# Patient Record
Sex: Female | Born: 1965 | Hispanic: No | Marital: Married | Smoking: Current some day smoker
Health system: Southern US, Community
[De-identification: ages and names within clinical notes are randomized; demographics above are authoritative.]

## PROBLEM LIST (undated history)

## (undated) DIAGNOSIS — I10 Essential (primary) hypertension: Secondary | ICD-10-CM

## (undated) HISTORY — PX: ABLATION: SHX5711

---

## 2005-11-20 ENCOUNTER — Other Ambulatory Visit: Admission: RE | Admit: 2005-11-20 | Discharge: 2005-11-20 | Payer: Self-pay | Admitting: *Deleted

## 2008-10-24 ENCOUNTER — Ambulatory Visit: Payer: Self-pay | Admitting: Obstetrics and Gynecology

## 2008-11-02 ENCOUNTER — Ambulatory Visit: Payer: Self-pay | Admitting: Obstetrics and Gynecology

## 2008-11-09 ENCOUNTER — Ambulatory Visit: Payer: Self-pay | Admitting: Obstetrics and Gynecology

## 2008-11-24 ENCOUNTER — Ambulatory Visit: Payer: Self-pay | Admitting: Obstetrics and Gynecology

## 2012-06-05 ENCOUNTER — Encounter (HOSPITAL_COMMUNITY): Payer: Self-pay | Admitting: *Deleted

## 2012-06-05 DIAGNOSIS — R040 Epistaxis: Secondary | ICD-10-CM | POA: Insufficient documentation

## 2012-06-05 DIAGNOSIS — I10 Essential (primary) hypertension: Secondary | ICD-10-CM | POA: Insufficient documentation

## 2012-06-05 NOTE — ED Notes (Signed)
The pt has had an intermittent nose bleed all day.  None now.  High bp

## 2012-06-06 ENCOUNTER — Emergency Department (HOSPITAL_COMMUNITY)
Admission: EM | Admit: 2012-06-06 | Discharge: 2012-06-06 | Disposition: A | Discharge status: Home / Self Care | Payer: Federal, State, Local not specified - PPO | Attending: Emergency Medicine | Admitting: Emergency Medicine

## 2012-06-06 ENCOUNTER — Encounter (HOSPITAL_COMMUNITY): Payer: Self-pay | Admitting: Emergency Medicine

## 2012-06-06 DIAGNOSIS — I10 Essential (primary) hypertension: Secondary | ICD-10-CM

## 2012-06-06 DIAGNOSIS — R040 Epistaxis: Secondary | ICD-10-CM

## 2012-06-06 HISTORY — DX: Essential (primary) hypertension: I10

## 2012-06-06 MED ORDER — HYDROCHLOROTHIAZIDE 25 MG PO TABS
25.0000 mg | ORAL_TABLET | Freq: Once | ORAL | Status: AC
  Administered 2012-06-06: 25 mg via ORAL
  Filled 2012-06-06: qty 1

## 2012-06-06 MED ORDER — OXYMETAZOLINE HCL 0.05 % NA SOLN
1.0000 | Freq: Two times a day (BID) | NASAL | Status: DC | PRN
  Administered 2012-06-06: 1 via NASAL
  Filled 2012-06-06: qty 15

## 2012-06-06 MED ORDER — AMLODIPINE BESYLATE 10 MG PO TABS
10.0000 mg | ORAL_TABLET | Freq: Once | ORAL | Status: AC
  Administered 2012-06-06: 10 mg via ORAL
  Filled 2012-06-06: qty 1

## 2012-06-06 NOTE — ED Provider Notes (Addendum)
History     CSN: [id number]  Arrival date & time 06/05/12  2330   First MD Initiated Contact with Patient 06/06/12 0057      Chief Complaint  Patient presents with  . Epistaxis    (Consider location/radiation/quality/duration/timing/severity/associated sxs/prior treatment) HPI Comments: Patient reports that she has a history of nasal congestion and allergies pretty significantly, has been worse the last few days and she also has been out of her blood pressure medications for the last 4 days and simply has not had the time to go by the pharmacy to pick them up. She developed a nosebleed earlier this morning which improved after she held pressure and held her face forward. There was some oozing that remained for approximately one hour but eventually stopped. She reports she does not take aspirin, Plavix, Coumadin or any other blood thinners and a regular basis. Typically she does not get nosebleeds. She denies any recent trauma, fevers, earache or sore throat. Most of the blood was from the right nares but a little trickled from the left side and she also tasted blood in the back of her throat. After the symptoms resolved, the patient was eating dinner this evening and then later had come home at about 8 PM at on the way home the bleeding started again. She again held pressure with improvement of the bleeding and by the time she arrived here in the emergency department the bleeding has again stopped. She denies any shortness of breath, nausea or vomiting.  Patient is a 46 y.o. female presenting with nosebleeds. The history is provided by the patient and the spouse.  Epistaxis     Past Medical History  Diagnosis Date  . Hypertension     History reviewed. No pertinent past surgical history.  History reviewed. No pertinent family history.  History  Substance Use Topics  . Smoking status: Current Some Day Smoker  . Smokeless tobacco: Not on file  . Alcohol Use: Yes    OB History    Grav Para Term Preterm Abortions TAB SAB Ect Mult Living                  Review of Systems  Constitutional: Negative for fever and chills.  HENT: Positive for nosebleeds and congestion. Negative for sore throat, trouble swallowing and sinus pressure.   Respiratory: Negative for shortness of breath.   Cardiovascular: Negative for chest pain.  Gastrointestinal: Negative for nausea and abdominal pain.  Neurological: Negative for numbness and headaches.  All other systems reviewed and are negative.    Allergies  Ace inhibitors and Molds & smuts  Home Medications   Current Outpatient Rx  Name Route Sig Dispense Refill  . AMLODIPINE-VALSARTAN-HCTZ 10-160-25 MG PO TABS Oral Take 1 tablet by mouth daily.    Marland Kitchen DANDELION ROOT PO Oral Take 1 capsule by mouth 3 (three) times a week. Does not take on any specific day    . ADULT MULTIVITAMIN W/MINERALS CH Oral Take 1 tablet by mouth daily.    Marland Kitchen FISH OIL 1200 MG PO CAPS Oral Take 1 capsule by mouth daily.    . SULINDAC 150 MG PO TABS Oral Take 150 mg by mouth daily.      BP 164/89  Pulse 86  Temp 98.2 F (36.8 C) (Oral)  Resp 18  SpO2 98%  LMP 05/05/2012  Physical Exam  Nursing note and vitals reviewed. Constitutional: She is oriented to person, place, and time. She appears well-developed and well-nourished. No distress.  HENT:  Head: Normocephalic.  Nose: Mucosal edema present. No rhinorrhea, nose lacerations, nasal deformity or septal deviation. Epistaxis is observed. Right sinus exhibits no maxillary sinus tenderness and no frontal sinus tenderness. Left sinus exhibits no maxillary sinus tenderness and no frontal sinus tenderness.  Mouth/Throat: Uvula is midline and mucous membranes are normal. Mucous membranes are not pale and not dry.       Small amount of blood in the posterior oropharynx without any obstruction  Eyes: Pupils are equal, round, and reactive to light.  Neck: Normal range of motion. Neck supple.  Cardiovascular:  Normal rate and regular rhythm.   Pulmonary/Chest: Effort normal.  Abdominal: Soft. She exhibits no distension. There is no tenderness.  Neurological: She is alert and oriented to person, place, and time. No cranial nerve deficit.  Skin: Skin is warm and dry. No rash noted. She is not diaphoretic. No pallor.  Psychiatric: She has a normal mood and affect.    ED Course  Procedures (including critical care time)  Labs Reviewed - No data to display No results found.   1. Epistaxis   2. Hypertension     2:56 AM Patient reports she did have a brief episode of some bleeding again when she was taking her blood pressure medication but it was controllable and again stopped on its own. I again asked her if she wished to have packing done and she declined. I reassured her and she promises to get her blood pressure medication as soon as possible tomorrow and I have referred her to ENT for followup next week.  MDM   Patient likely with epistaxis due to a combination of hypertension as well as history of nasal and sinus issues. The patient is able to get it to stop on its own. I do not suspect A. arterial bleed. Discussed risks and benefits of packing her nose and she has declined at this time. We agreed to give her her blood pressure medications, monitor her for the next one to 2 hours for any recurrence. If no recurrent bleeding occurs, plan is to discharge her home with some Afrin. She understands that Afrin may cause her blood pressure to increase in that it is important that she restart her blood pressure medications at home. I will also refer her to ENT for appropriate followup later this week for reevaluation.        Gavin Pound. Oletta Lamas, MD 06/06/12 4098  Gavin Pound. Oletta Lamas, MD 06/06/12 1191

## 2012-06-06 NOTE — ED Notes (Addendum)
Pt. Reports nose bleeding since 11am yesterday. Comes and goes. Currently bleeding. Pt. Is sitting up in [place] putting pressure on the bridge of the nose.  "Right now it's not running down my throat like it was". Reports small clots. A.O. X 4. NAD. Respirations even and regular. Denies difficulty breathing.  Hypertensive. States " I haven't taken my blood pressure medication in several days. I am out."

## 2012-06-06 NOTE — ED Notes (Addendum)
Pt. D/c home. Ambulatory. Verbalized understanding of d/c instructions. Denies and pain. Denies any further questions at this time. Skin warm, dry and intact. Respirations even and regular. A.O. X 4. NAD. Vitals stable.

## 2012-06-06 NOTE — ED Notes (Signed)
Dr. Ghim at bedside. 

## 2012-06-06 NOTE — Discharge Instructions (Signed)
 Nosebleed Nosebleeds can be caused by many conditions including trauma, infections, polyps, foreign bodies, dry mucous membranes or climate, medications and air conditioning. Most nosebleeds occur in the front of the nose. It is because of this location that most nosebleeds can be controlled by pinching the nostrils gently and continuously. Do this for at least 10 to 20 minutes. The reason for this long continuous pressure is that you must hold it long enough for the blood to clot. If during that 10 to 20 minute time period, pressure is released, the process may have to be started again. The nosebleed may stop by itself, quit with pressure, need concentrated heating (cautery) or stop with pressure from packing. HOME CARE INSTRUCTIONS   If your nose was packed, try to maintain the pack inside until your caregiver removes it. If a gauze pack was used and it starts to fall out, gently replace or cut the end off. Do not cut if a balloon catheter was used to pack the nose. Otherwise, do not remove unless instructed.   Avoid blowing your nose for 12 hours after treatment. This could dislodge the pack or clot and start bleeding again.   If the bleeding starts again, sit up and bending forward, gently pinch the front half of your nose continuously for 20 minutes.   If bleeding was caused by dry mucous membranes, cover the inside of your nose every morning with a petroleum or antibiotic ointment. Use your little fingertip as an applicator. Do this as needed during dry weather. This will keep the mucous membranes moist and allow them to heal.   Maintain humidity in your home by using less air conditioning or using a humidifier.   Do not use aspirin or medications which make bleeding more likely. Your caregiver can give you recommendations on this.   Resume normal activities as able but try to avoid straining, lifting or bending at the waist for several days.   If the nosebleeds become recurrent and the cause  is unknown, your caregiver may suggest laboratory tests.  SEEK IMMEDIATE MEDICAL CARE IF:   Bleeding recurs and cannot be controlled.   There is unusual bleeding from or bruising on other parts of the body.   You have a fever.   Nosebleeds continue.   There is any worsening of the condition which originally brought you in.   You become lightheaded, feel faint, become sweaty or vomit blood.  MAKE SURE YOU:   Understand these instructions.   Will watch your condition.   Will get help right away if you are not doing well or get worse.  Document Released: 07/30/2005 Document Revised: 10/09/2011 Document Reviewed: 09/21/2009 [facility] Patient Information 2012 [place], [place].    Arterial Hypertension Arterial hypertension (high blood pressure) is a condition of elevated pressure in your blood vessels. Hypertension over a long period of time is a risk factor for strokes, heart attacks, and heart failure. It is also the leading cause of kidney (renal) failure.  CAUSES   In Adults -- Over 90% of all hypertension has no known cause. This is called essential or primary hypertension. In the other 10% of people with hypertension, the increase in blood pressure is caused by another disorder. This is called secondary hypertension. Important causes of secondary hypertension are:   Heavy alcohol  use.   Obstructive sleep apnea.   Hyperaldosterosim (Conn's syndrome).   Steroid use.   Chronic kidney failure.   Hyperparathyroidism.   Medications.   Renal artery stenosis.  Pheochromocytoma.   Cushing's disease.   Coarctation of the aorta.   Scleroderma renal crisis.   Licorice (in excessive amounts).   Drugs (cocaine, methamphetamine).  Your caregiver can explain any items above that apply to you.  In Children -- Secondary hypertension is more common and should always be considered.   Pregnancy -- Few women of childbearing age have high blood pressure. However, up to 10% of  them develop hypertension of pregnancy. Generally, this will not harm the woman. It may be a sign of 3 complications of pregnancy: preeclampsia, HELLP syndrome, and eclampsia. Follow up and control with medication is necessary.  SYMPTOMS   This condition normally does not produce any noticeable symptoms. It is usually found during a routine exam.   Malignant hypertension is a late problem of high blood pressure. It may have the following symptoms:   Headaches.   Blurred vision.   End-organ damage (this means your kidneys, heart, lungs, and other organs are being damaged).   Stressful situations can increase the blood pressure. If a person with normal blood pressure has their blood pressure go up while being seen by their caregiver, this is often termed white coat hypertension. Its importance is not known. It may be related with eventually developing hypertension or complications of hypertension.   Hypertension is often confused with mental tension, stress, and anxiety.  DIAGNOSIS  The diagnosis is made by 3 separate blood pressure measurements. They are taken at least 1 week apart from each other. If there is organ damage from hypertension, the diagnosis may be made without repeat measurements. Hypertension is usually identified by having blood pressure readings:  Above 140/90 mmHg measured in both arms, at 3 separate times, over a couple weeks.   Over 130/80 mmHg should be considered a risk factor and may require treatment in patients with diabetes.  Blood pressure readings over 120/80 mmHg are called pre-hypertension even in non-diabetic patients. To get a true blood pressure measurement, use the following guidelines. Be aware of the factors that can alter blood pressure readings.  Take measurements at least 1 hour after caffeine.   Take measurements 30 minutes after smoking and without any stress. This is another reason to quit smoking - it raises your blood pressure.   Use a  proper cuff size. Ask your caregiver if you are not sure about your cuff size.   Most home blood pressure cuffs are automatic. They will measure systolic and diastolic pressures. The systolic pressure is the pressure reading at the start of sounds. Diastolic pressure is the pressure at which the sounds disappear. If you are elderly, measure pressures in multiple postures. Try sitting, lying or standing.   Sit at rest for a minimum of 5 minutes before taking measurements.   You should not be on any medications like decongestants. These are found in many cold medications.   Record your blood pressure readings and review them with your caregiver.  If you have hypertension:  Your caregiver may do tests to be sure you do not have secondary hypertension (see causes above).   Your caregiver may also look for signs of metabolic syndrome. This is also called Syndrome X or Insulin  Resistance Syndrome. You may have this syndrome if you have type 2 diabetes, abdominal obesity, and abnormal blood lipids in addition to hypertension.   Your caregiver will take your medical and family history and perform a physical exam.   Diagnostic tests may include blood tests (for glucose, cholesterol, potassium, and kidney function),  a urinalysis, or an EKG. Other tests may also be necessary depending on your condition.  PREVENTION  There are important lifestyle issues that you can adopt to reduce your chance of developing hypertension:  Maintain a normal weight.   Limit the amount of salt (sodium) in your diet.   Exercise often.   Limit alcohol  intake.   Get enough potassium in your diet. Discuss specific advice with your caregiver.   Follow a DASH diet (dietary approaches to stop hypertension). This diet is rich in fruits, vegetables, and low-fat dairy products, and avoids certain fats.  PROGNOSIS  Essential hypertension cannot be cured. Lifestyle changes and medical treatment can lower blood pressure and  reduce complications. The prognosis of secondary hypertension depends on the underlying cause. Many people whose hypertension is controlled with medicine or lifestyle changes can live a normal, healthy life.  RISKS AND COMPLICATIONS  While high blood pressure alone is not an illness, it often requires treatment due to its short- and long-term effects on many organs. Hypertension increases your risk for:  CVAs or strokes (cerebrovascular accident).   Heart failure due to chronically high blood pressure (hypertensive cardiomyopathy).   Heart attack (myocardial infarction).   Damage to the retina (hypertensive retinopathy).   Kidney failure (hypertensive nephropathy).  Your caregiver can explain list items above that apply to you. Treatment of hypertension can significantly reduce the risk of complications. TREATMENT   For overweight patients, weight loss and regular exercise are recommended. Physical fitness lowers blood pressure.   Mild hypertension is usually treated with diet and exercise. A diet rich in fruits and vegetables, fat-free dairy products, and foods low in fat and salt (sodium) can help lower blood pressure. Decreasing salt intake decreases blood pressure in a 1/3 of people.   Stop smoking if you are a smoker.  The steps above are highly effective in reducing blood pressure. While these actions are easy to suggest, they are difficult to achieve. Most patients with moderate or severe hypertension end up requiring medications to bring their blood pressure down to a normal level. There are several classes of medications for treatment. Blood pressure pills (antihypertensives) will lower blood pressure by their different actions. Lowering the blood pressure by 10 mmHg may decrease the risk of complications by as much as 25%. The goal of treatment is effective blood pressure control. This will reduce your risk for complications. Your caregiver will help you determine the best treatment  for you according to your lifestyle. What is excellent treatment for one person, may not be for you. HOME CARE INSTRUCTIONS   Do not smoke.   Follow the lifestyle changes outlined in the Prevention section.   If you are on medications, follow the directions carefully. Blood pressure medications must be taken as prescribed. Skipping doses reduces their benefit. It also puts you at risk for problems.   Follow up with your caregiver, as directed.   If you are asked to monitor your blood pressure at home, follow the guidelines in the Diagnosis section above.  SEEK MEDICAL CARE IF:   You think you are having medication side effects.   You have recurrent headaches or lightheadedness.   You have swelling in your ankles.   You have trouble with your vision.  SEEK IMMEDIATE MEDICAL CARE IF:   You have sudden onset of chest pain or pressure, difficulty breathing, or other symptoms of a heart attack.   You have a severe headache.   You have symptoms of a stroke (such  as sudden weakness, difficulty speaking, difficulty walking).  MAKE SURE YOU:   Understand these instructions.   Will watch your condition.   Will get help right away if you are not doing well or get worse.  Document Released: 10/20/2005 Document Revised: 10/09/2011 Document Reviewed: 05/20/2007 [facility] Patient Information 2012 [place], [place].

## 2012-11-06 ENCOUNTER — Encounter (HOSPITAL_COMMUNITY): Payer: Self-pay | Admitting: Emergency Medicine

## 2012-11-06 ENCOUNTER — Emergency Department (HOSPITAL_COMMUNITY)
Admission: EM | Admit: 2012-11-06 | Discharge: 2012-11-07 | Disposition: A | Discharge status: Home / Self Care | Payer: Federal, State, Local not specified - PPO | Attending: Emergency Medicine | Admitting: Emergency Medicine

## 2012-11-06 DIAGNOSIS — F10929 Alcohol use, unspecified with intoxication, unspecified: Secondary | ICD-10-CM

## 2012-11-06 DIAGNOSIS — E876 Hypokalemia: Secondary | ICD-10-CM | POA: Insufficient documentation

## 2012-11-06 DIAGNOSIS — F1010 Alcohol abuse, uncomplicated: Secondary | ICD-10-CM | POA: Insufficient documentation

## 2012-11-06 DIAGNOSIS — Z79899 Other long term (current) drug therapy: Secondary | ICD-10-CM | POA: Insufficient documentation

## 2012-11-06 DIAGNOSIS — R4189 Other symptoms and signs involving cognitive functions and awareness: Secondary | ICD-10-CM

## 2012-11-06 DIAGNOSIS — F17200 Nicotine dependence, unspecified, uncomplicated: Secondary | ICD-10-CM | POA: Insufficient documentation

## 2012-11-06 DIAGNOSIS — I10 Essential (primary) hypertension: Secondary | ICD-10-CM | POA: Insufficient documentation

## 2012-11-06 LAB — ACETAMINOPHEN LEVEL: Acetaminophen (Tylenol), Serum: <15 ug/mL (ref 10–30)

## 2012-11-06 LAB — COMPREHENSIVE METABOLIC PANEL
ALT: 33 U/L (ref 0–35)
Alkaline Phosphatase: 88 U/L (ref 39–117)
BUN: 8 mg/dL (ref 6–23)
Chloride: 96 mEq/L (ref 96–112)
GFR calc Af Amer: >90 mL/min (ref >90)
Glucose, Bld: 120 mg/dL — ABNORMAL HIGH (ref 70–99)
Potassium: 3.1 mEq/L — ABNORMAL LOW (ref 3.5–5.1)
Sodium: 135 mEq/L (ref 135–145)
Total Bilirubin: 0.1 mg/dL — ABNORMAL LOW (ref 0.3–1.2)
Total Protein: 7.8 g/dL (ref 6.0–8.3)

## 2012-11-06 LAB — CBC WITH DIFFERENTIAL/PLATELET
Eosinophils Absolute: 0 10*3/uL (ref 0.0–0.7)
Hemoglobin: 13.4 g/dL (ref 12.0–15.0)
Lymphocytes Relative: 23 % (ref 12–46)
Lymphs Abs: 1.5 10*3/uL (ref 0.7–4.0)
MCH: 31.4 pg (ref 26.0–34.0)
Monocytes Relative: 8 % (ref 3–12)
Neutro Abs: 4.5 10*3/uL (ref 1.7–7.7)
Neutrophils Relative %: 68 % (ref 43–77)
Platelets: 342 10*3/uL (ref 150–400)
RBC: 4.27 MIL/uL (ref 3.87–5.11)
WBC: 6.6 10*3/uL (ref 4.0–10.5)

## 2012-11-06 LAB — RAPID URINE DRUG SCREEN, HOSP PERFORMED
Barbiturates: NOT DETECTED
Benzodiazepines: NOT DETECTED
Cocaine: NOT DETECTED
Tetrahydrocannabinol: NOT DETECTED

## 2012-11-06 LAB — ETHANOL: Alcohol, Ethyl (B): 364 mg/dL — ABNORMAL HIGH (ref 0–11)

## 2012-11-06 MED ORDER — SODIUM CHLORIDE 0.9 % IV SOLN
INTRAVENOUS | Status: DC
  Administered 2012-11-06: 22:00:00 via INTRAVENOUS

## 2012-11-06 NOTE — ED Notes (Signed)
Pt refused temp 

## 2012-11-06 NOTE — ED Notes (Addendum)
Upon reentering Res Room B pt restraints were observed to be untied.  Husband is in the room with wife.

## 2012-11-06 NOTE — ED Notes (Signed)
ZOX:[id number]<AV> Expected date:<BR> Expected time:<BR> Means of arrival:<BR> Comments:<BR> ems

## 2012-11-06 NOTE — ED Notes (Signed)
Per pt's friend. They were at at a friends house drinking when the pt became altered. States her eyes rolled back in her head and she was "salivating."  The pt is combative and completely uncooperative, kicking at staff and calling them names. Pt also refused to answer any questions.

## 2012-11-06 NOTE — ED Provider Notes (Signed)
History     CSN: [id number]  Arrival date & time 11/06/12  2152   First MD Initiated Contact with Patient 11/06/12 2202      Chief complaint altered mental status  (Consider location/radiation/quality/duration/timing/severity/associated sxs/prior treatment) HPI  Patient presents to the emergency department via EMS. Patient evidently was combative in route to the emergency department. On arrival to the ER patient is combative to the nursing staff and refuses to tell us her name or what is going on. Whenever we asked her questions she answers back such as "where are you", " do you know where you are".  Patient's friend rode in the ambulance with her and the friend is the one who called EMS. She reports that they had been drinking tonight. She states the patient did not drink more than usual. She states they've been together since about 4 PM and she was drinking beer. She states that prior to calling EMS  patient had an episode where she was drooling,  her eyes rolled back in her head while she was sitting up. She denies any jerking. She states that patient has not been taking any pills. She states patient has been depressed because her mother died recently however she does not feel the patient is suicidal.  PCP Dr. Belva Crome  Past Medical History  Diagnosis Date  . Hypertension     Past Surgical History  Procedure Date  . Ablation     No family history on file.  History  Substance Use Topics  . Smoking status: Current Some Day Smoker  . Smokeless tobacco: Not on file  . Alcohol Use: Yes  employed Lives at home Lives with spouse  OB History    Grav Para Term Preterm Abortions TAB SAB Ect Mult Living                  Review of Systems  Unable to perform ROS: Other    Allergies  Ace inhibitors and Molds & smuts  Home Medications   Current Outpatient Rx  Name  Route  Sig  Dispense  Refill  . AMLODIPINE-VALSARTAN-HCTZ 10-160-25 MG PO TABS   Oral   Take 1 tablet by  mouth daily.         Marland Kitchen DANDELION ROOT PO   Oral   Take 1 capsule by mouth 3 (three) times a week. Does not take on any specific day         . ADULT MULTIVITAMIN W/MINERALS CH   Oral   Take 1 tablet by mouth daily.         Marland Kitchen FISH OIL 1200 MG PO CAPS   Oral   Take 1 capsule by mouth daily.         . SULINDAC 150 MG PO TABS   Oral   Take 150 mg by mouth daily.           BP 131/89  Pulse 94  Resp 23  SpO2 91%  Vital signs normal    Physical Exam  Nursing note and vitals reviewed. Constitutional: She appears well-developed and well-nourished.  Non-toxic appearance. She does not appear ill. No distress.       Patient is combative and refuses to be touched for exam she's totally uncooperative and will not open her mouth  HENT:  Head: Normocephalic and atraumatic.  Right Ear: External ear normal.  Left Ear: External ear normal.  Nose: Nose normal. No mucosal edema or rhinorrhea.  Mouth/Throat: Mucous membranes are normal. No dental abscesses  or uvula swelling.  Eyes: Conjunctivae normal and EOM are normal. Pupils are equal, round, and reactive to light.  Neck: Normal range of motion and full passive range of motion without pain. Neck supple.  Pulmonary/Chest: Effort normal and breath sounds normal. No respiratory distress. She has no rhonchi. She exhibits no crepitus.  Abdominal: Normal appearance.  Musculoskeletal: Normal range of motion. She exhibits no edema and no tenderness.       Moves all extremities well.   Neurological: She is alert. She has normal strength. No cranial nerve deficit.  Skin: Skin is warm, dry and intact. No rash noted. No erythema. No pallor.  Psychiatric: Her speech is normal. Her mood appears not anxious.       Patient is uncooperative and aggressive to staff    ED Course  Procedures (including critical care time)  Medications  0.9 %  sodium chloride infusion (0  Intravenous Stopped 11/06/12 2331)     2330 has been here., He states  patient was drinking tonight but no more than usual. He states she started acting "weird" meaning that she was staring and not responsive to them. He states he's never seen her act that way before. Patient continues to be verbally belligerent but now she is more talkative. He states she's back to her usual self. He is agreeable to taking her home after her labs tests are resulted if they were okay. Husband had taking patient out of restraints that were placed on her when she continued to be physically aggressive to the nursing staff. She also had her IV pulled out.  Recheck 00:30 pt awake, still verbally hostile but no longer physically aggressive, husband at bedside.   Results for orders placed during the hospital encounter of 11/06/12  ETHANOL      Component Value Range   Alcohol, Ethyl (B) 364 (*) 0 - 11 mg/dL  CBC WITH DIFFERENTIAL      Component Value Range   WBC 6.6  4.0 - 10.5 K/uL   RBC 4.27  3.87 - 5.11 MIL/uL   Hemoglobin 13.4  12.0 - 15.0 g/dL   HCT 04.5  40.9 - 81.1 %   MCV 87.1  78.0 - 100.0 fL   MCH 31.4  26.0 - 34.0 pg   MCHC 36.0  30.0 - 36.0 g/dL   RDW 91.4  78.2 - 95.6 %   Platelets 342  150 - 400 K/uL   Neutrophils Relative 68  43 - 77 %   Neutro Abs 4.5  1.7 - 7.7 K/uL   Lymphocytes Relative 23  12 - 46 %   Lymphs Abs 1.5  0.7 - 4.0 K/uL   Monocytes Relative 8  3 - 12 %   Monocytes Absolute 0.5  0.1 - 1.0 K/uL   Eosinophils Relative 0  0 - 5 %   Eosinophils Absolute 0.0  0.0 - 0.7 K/uL   Basophils Relative 1  0 - 1 %   Basophils Absolute 0.0  0.0 - 0.1 K/uL  COMPREHENSIVE METABOLIC PANEL      Component Value Range   Sodium 135  135 - 145 mEq/L   Potassium 3.1 (*) 3.5 - 5.1 mEq/L   Chloride 96  96 - 112 mEq/L   CO2 23  19 - 32 mEq/L   Glucose, Bld 120 (*) 70 - 99 mg/dL   BUN 8  6 - 23 mg/dL   Creatinine, Ser 2.13  0.50 - 1.10 mg/dL   Calcium 9.5  8.4 - 08.6  mg/dL   Total Protein 7.8  6.0 - 8.3 g/dL   Albumin 4.3  3.5 - 5.2 g/dL   AST 34  0 - 37 U/L   ALT  33  0 - 35 U/L   Alkaline Phosphatase 88  39 - 117 U/L   Total Bilirubin 0.1 (*) 0.3 - 1.2 mg/dL   GFR calc non Af Amer >90  >90 mL/min   GFR calc Af Amer >90  >90 mL/min  ACETAMINOPHEN LEVEL      Component Value Range   Acetaminophen (Tylenol), Serum <15.0  10 - 30 ug/mL  SALICYLATE LEVEL      Component Value Range   Salicylate Lvl <2.0 (*) 2.8 - 20.0 mg/dL  URINE RAPID DRUG SCREEN (HOSP PERFORMED)      Component Value Range   Opiates NONE DETECTED  NONE DETECTED   Cocaine NONE DETECTED  NONE DETECTED   Benzodiazepines NONE DETECTED  NONE DETECTED   Amphetamines NONE DETECTED  NONE DETECTED   Tetrahydrocannabinol NONE DETECTED  NONE DETECTED   Barbiturates NONE DETECTED  NONE DETECTED    Laboratory interpretation all normal except hypokalemia, alcohol intoxication    Date: 11/06/2012  Rate: 103  Rhythm: sinus tachycardia  QRS Axis: normal  Intervals: normal  ST/T Wave abnormalities: nonspecific T wave changes  Conduction Disutrbances:none  Narrative Interpretation:   Old EKG Reviewed: none available    1. Alcohol intoxication   2. Unresponsive episode   3. Hypokalemia    New Prescriptions   POTASSIUM CHLORIDE SA (K-DUR,KLOR-CON) 20 MEQ TABLET    Take 1 tablet (20 mEq total) by mouth 2 (two) times daily.     Plan discharge when more sober  Devoria Albe, MD, FACEP   MDM          Ward Givens, MD 11/07/12 [phone]

## 2012-11-07 MED ORDER — ZIPRASIDONE MESYLATE 20 MG IM SOLR
INTRAMUSCULAR | Status: AC
  Filled 2012-11-07: qty 20

## 2012-11-07 MED ORDER — POTASSIUM CHLORIDE CRYS ER 20 MEQ PO TBCR: 20.0000 meq | EXTENDED_RELEASE_TABLET | Freq: Two times a day (BID) | ORAL | Status: AC

## 2012-11-07 MED ORDER — ZIPRASIDONE MESYLATE 20 MG IM SOLR
10.0000 mg | Freq: Once | INTRAMUSCULAR | Status: AC
Start: 2012-11-07 — End: 2012-11-07
  Administered 2012-11-07: 10 mg via INTRAMUSCULAR

## 2012-11-07 NOTE — ED Notes (Signed)
Spoke with patient husband Genelle Bal at [phone], advised patient may be discharged upon his arrival. He states he will be here shortly

## 2012-11-07 NOTE — ED Notes (Signed)
Pt became unruly with Dr Norlene Campbell.  Dr Norlene Campbell prescribed Geodon 10mg .  Pt's husband was called to bedside where he gave verbal consent for the staff of [facility] to keep the patient until a time when her alcohol level returned to an acceptable rate.  Pt's belongings to include a pair of jeans with $180 and a purse where given to patients husband to take home.  Pt's husband advised that he should return in the morning to pick up the patient.

## 2012-11-07 NOTE — Discharge Instructions (Signed)
You shouldn't drink so much!! You should have Dr Belva Crome recheck you in the next 1-2 weeks.

## 2012-11-07 NOTE — ED Provider Notes (Signed)
Care assumed from Dr. Lynelle Doctor. Patient with alcohol intoxication and altered mental status.  Do to significantly elevated alcohol level, plan was to keep patient in the emergency department and allow to sober given fear for aspiration risk. Patient became agitated, verbally abusive towards staff. She is still very unsteady on her feet and making illogical comments. Patient given Geodon to help with agitation. We'll continue to monitor her closely and expect that she'll be able to be discharged in the morning.  Olivia Mackie, MD 11/07/12 [phone]

## 2016-05-08 DIAGNOSIS — F4322 Adjustment disorder with anxiety: Secondary | ICD-10-CM | POA: Diagnosis not present

## 2016-05-08 DIAGNOSIS — F411 Generalized anxiety disorder: Secondary | ICD-10-CM | POA: Diagnosis not present

## 2016-05-19 DIAGNOSIS — F5101 Primary insomnia: Secondary | ICD-10-CM | POA: Diagnosis not present

## 2016-05-19 DIAGNOSIS — M545 Low back pain: Secondary | ICD-10-CM | POA: Diagnosis not present

## 2016-05-19 DIAGNOSIS — I10 Essential (primary) hypertension: Secondary | ICD-10-CM | POA: Diagnosis not present

## 2016-05-28 DIAGNOSIS — F4310 Post-traumatic stress disorder, unspecified: Secondary | ICD-10-CM | POA: Diagnosis not present

## 2016-06-18 DIAGNOSIS — F4311 Post-traumatic stress disorder, acute: Secondary | ICD-10-CM | POA: Diagnosis not present

## 2016-06-18 DIAGNOSIS — F410 Panic disorder [episodic paroxysmal anxiety] without agoraphobia: Secondary | ICD-10-CM | POA: Diagnosis not present

## 2016-07-02 DIAGNOSIS — F4310 Post-traumatic stress disorder, unspecified: Secondary | ICD-10-CM | POA: Diagnosis not present

## 2016-07-23 DIAGNOSIS — F4310 Post-traumatic stress disorder, unspecified: Secondary | ICD-10-CM | POA: Diagnosis not present

## 2016-08-20 DIAGNOSIS — F4311 Post-traumatic stress disorder, acute: Secondary | ICD-10-CM | POA: Diagnosis not present

## 2016-11-13 DIAGNOSIS — Z0000 Encounter for general adult medical examination without abnormal findings: Secondary | ICD-10-CM | POA: Diagnosis not present

## 2016-11-13 DIAGNOSIS — I10 Essential (primary) hypertension: Secondary | ICD-10-CM | POA: Diagnosis not present

## 2016-11-13 DIAGNOSIS — Z1231 Encounter for screening mammogram for malignant neoplasm of breast: Secondary | ICD-10-CM | POA: Diagnosis not present

## 2016-11-13 DIAGNOSIS — Z23 Encounter for immunization: Secondary | ICD-10-CM | POA: Diagnosis not present

## 2016-11-13 DIAGNOSIS — Z1389 Encounter for screening for other disorder: Secondary | ICD-10-CM | POA: Diagnosis not present

## 2016-12-28 DIAGNOSIS — F17210 Nicotine dependence, cigarettes, uncomplicated: Secondary | ICD-10-CM | POA: Diagnosis not present

## 2016-12-28 DIAGNOSIS — M5136 Other intervertebral disc degeneration, lumbar region: Secondary | ICD-10-CM | POA: Diagnosis not present

## 2016-12-28 DIAGNOSIS — S73101A Unspecified sprain of right hip, initial encounter: Secondary | ICD-10-CM | POA: Diagnosis not present

## 2016-12-28 DIAGNOSIS — M5137 Other intervertebral disc degeneration, lumbosacral region: Secondary | ICD-10-CM | POA: Diagnosis not present

## 2016-12-28 DIAGNOSIS — S39012A Strain of muscle, fascia and tendon of lower back, initial encounter: Secondary | ICD-10-CM | POA: Diagnosis not present

## 2016-12-28 DIAGNOSIS — S3992XA Unspecified injury of lower back, initial encounter: Secondary | ICD-10-CM | POA: Diagnosis not present

## 2016-12-28 DIAGNOSIS — S79911A Unspecified injury of right hip, initial encounter: Secondary | ICD-10-CM | POA: Diagnosis not present

## 2017-01-05 DIAGNOSIS — M62830 Muscle spasm of back: Secondary | ICD-10-CM | POA: Diagnosis not present

## 2017-01-05 DIAGNOSIS — M5127 Other intervertebral disc displacement, lumbosacral region: Secondary | ICD-10-CM | POA: Diagnosis not present

## 2017-01-05 DIAGNOSIS — M5416 Radiculopathy, lumbar region: Secondary | ICD-10-CM | POA: Diagnosis not present

## 2017-01-05 DIAGNOSIS — M5441 Lumbago with sciatica, right side: Secondary | ICD-10-CM | POA: Diagnosis not present

## 2017-01-16 DIAGNOSIS — M7061 Trochanteric bursitis, right hip: Secondary | ICD-10-CM | POA: Diagnosis not present

## 2017-01-16 DIAGNOSIS — M5441 Lumbago with sciatica, right side: Secondary | ICD-10-CM | POA: Diagnosis not present

## 2017-01-16 DIAGNOSIS — M62830 Muscle spasm of back: Secondary | ICD-10-CM | POA: Diagnosis not present

## 2017-01-16 DIAGNOSIS — M5416 Radiculopathy, lumbar region: Secondary | ICD-10-CM | POA: Diagnosis not present

## 2017-01-16 DIAGNOSIS — M5127 Other intervertebral disc displacement, lumbosacral region: Secondary | ICD-10-CM | POA: Diagnosis not present

## 2017-01-23 DIAGNOSIS — M5127 Other intervertebral disc displacement, lumbosacral region: Secondary | ICD-10-CM | POA: Diagnosis not present

## 2017-01-23 DIAGNOSIS — S76311S Strain of muscle, fascia and tendon of the posterior muscle group at thigh level, right thigh, sequela: Secondary | ICD-10-CM | POA: Diagnosis not present

## 2017-01-23 DIAGNOSIS — M5416 Radiculopathy, lumbar region: Secondary | ICD-10-CM | POA: Diagnosis not present

## 2017-01-23 DIAGNOSIS — M5441 Lumbago with sciatica, right side: Secondary | ICD-10-CM | POA: Diagnosis not present

## 2017-02-03 ENCOUNTER — Encounter: Payer: Self-pay | Admitting: Physical Therapy

## 2017-02-03 ENCOUNTER — Ambulatory Visit: Payer: Federal, State, Local not specified - PPO | Attending: Family Medicine | Admitting: Physical Therapy

## 2017-02-03 DIAGNOSIS — M5441 Lumbago with sciatica, right side: Secondary | ICD-10-CM

## 2017-02-03 DIAGNOSIS — R262 Difficulty in walking, not elsewhere classified: Secondary | ICD-10-CM | POA: Insufficient documentation

## 2017-02-03 NOTE — Therapy (Signed)
[facility] [address] [place], [place], [address] Phone: [phone]   Fax:  [phone]  Physical Therapy Evaluation  Patient Details  Name: Suzanne Hines MRN: [id number] Date of Birth: 07-May-1966 Referring Provider: Juanita Laster  Encounter Date: 02/03/2017      PT End of Session - 02/03/17 1639    Visit Number 1   Date for PT Re-Evaluation 04/05/17   PT Start Time 1609   PT Stop Time 1703   PT Time Calculation (min) 54 min   Activity Tolerance Patient tolerated treatment well   Behavior During Therapy [facility] for tasks assessed/performed      Past Medical History:  Diagnosis Date  . Hypertension     Past Surgical History:  Procedure Laterality Date  . ABLATION      There were no vitals filed for this visit.       Subjective Assessment - 02/03/17 1609    Subjective Patient reports that she injured her back and hip when she slipped at a restaurant on 12/27/16.  CT scan showed DDD L4-S1 with some narrowing.  She had a couple of injections that helped quite a bit but still is having pain in the right HS and lateral thigh area.     Limitations Sitting;Lifting;Standing;Walking;House hold activities   Patient Stated Goals walk and have less pain   Currently in Pain? Yes   Pain Score 7    Pain Location Leg   Pain Orientation Right;Posterior;Upper   Pain Descriptors / Indicators Aching   Pain Type Acute pain   Pain Onset More than a month ago   Pain Frequency Constant   Aggravating Factors  as the day goes on it will get worse up to 9/10   Pain Relieving Factors rest, change of positions, mm relaxer pain can get down to a 3-4/10   Effect of Pain on Daily Activities limits ADL's, hurts, I have to use a cane            [facility] PT Assessment - 02/03/17 0001      Assessment   Medical Diagnosis right HS pain, back pain   Referring Provider Juanita Laster   Onset Date/Surgical Date 12/27/16   Prior  Therapy no     Precautions   Precautions None     Balance Screen   Has the patient fallen in the past 6 months Yes   How many times? 1   Has the patient had a decrease in activity level because of a fear of falling?  No   Is the patient reluctant to leave their home because of a fear of falling?  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, stairs, walk on uneven terrain,   Leisure was walking for exercise 45 minutes a day     Posture/Postural Control   Posture Comments fwd head, rounded shoulders     ROM / Strength   AROM / PROM / Strength AROM;Strength     AROM   Overall AROM Comments Lumbar ROM Decreased 25% with some pain in the right lateral thigh   AROM Assessment Site Hip   Right/Left Hip Right   Right Hip Extension 20   Right Hip Flexion 90   Right Hip External Rotation  20   Right Hip  Internal Rotation  10   Right Hip ABduction 20     Strength   Overall Strength Comments right hip 4-/5, right knee 4-/5 with pain in the right thigh posterior and laterally     Flexibility   Soft Tissue Assessment /Muscle Length --  tight ITB, HS and piriformis mms     Palpation   Palpation comment right buttock, right HS and lateral thigh very tender, very tight lumbar paraspinals     Special Tests    Special Tests --  right SLR to 40 degrees painful     Ambulation/Gait   Gait Comments uses a SPC, slow, antalgic gait on the right                   [facility] Adult PT Treatment/Exercise - 02/03/17 0001      Modalities   Modalities Electrical Stimulation;Moist Heat     Moist Heat Therapy   Number Minutes Moist Heat 15 Minutes   Moist Heat Location Hip     Electrical Stimulation   Electrical Stimulation Location right buttock into the HS area   Electrical Stimulation Action IFC   Electrical Stimulation  Parameters supine with leg elevated   Electrical Stimulation Goals Pain                PT Education - 02/03/17 1637    Education provided Yes   Education Details Wms Flexion, HS, ITB and piriformis stretches   Person(s) Educated Patient   Methods Explanation;Demonstration;Handout   Comprehension Verbalized understanding          PT Short Term Goals - 02/03/17 1645      PT SHORT TERM GOAL #1   Title independent with initial HEP   Time 2   Period Weeks   Status New           PT Long Term Goals - 02/03/17 1646      PT LONG TERM GOAL #1   Title walk all distances without assistive device   Time 8   Period Weeks   Status New     PT LONG TERM GOAL #2   Title increase strength of the right LE to 4+/5   Time 8   Period Weeks   Status New     PT LONG TERM GOAL #3   Title decrease pain 50%   Time 8   Period Weeks   Status New     PT LONG TERM GOAL #4   Title increase SLR of the right LE to 70 degrees   Time 8   Period Weeks   Status New               Plan - 02/03/17 1641    Clinical Impression Statement Patient slipped at a restaurant on 12/27/16, she reports "doing the splits", she has had some back, right buttock and right HS pain since that time, she is using a SPC for ambulation, has antalgic gait on the right, postive SLR on the right at 40 degrees   Rehab Potential Good   PT Frequency 2x / week   PT Duration 8 weeks   PT Treatment/Interventions ADLs/Self Care Home Management;Cryotherapy;Electrical Stimulation;Iontophoresis /ml Dexamethasone;Functional mobility training;Gait training;Stair training;Ultrasound;Traction;Moist Heat;Therapeutic exercise;Therapeutic activities;Balance training;Neuromuscular re-education;Patient/family education;Manual techniques   PT Next Visit Plan Add gym exercises, could try ionto   Consulted and Agree with Plan of Care Patient      Patient will benefit from skilled therapeutic intervention in order to  improve the following deficits  and impairments:  Abnormal gait, Decreased activity tolerance, Decreased balance, Decreased mobility, Decreased strength, Impaired flexibility, Improper body mechanics, Pain, Difficulty walking, Decreased range of motion  Visit Diagnosis: Acute right-sided low back pain with right-sided sciatica - Plan: PT plan of care cert/re-cert  Difficulty in walking, not elsewhere classified - Plan: PT plan of care cert/re-cert      G-Codes - 2017-02-26 1742    Functional Assessment Tool Used (Outpatient Only) foto 63%   Functional Limitation Mobility: Walking and moving around   Mobility: Walking and Moving Around Current Status (Z6109) At least 60 percent but less than 80 percent impaired, limited or restricted   Mobility: Walking and Moving Around Goal Status (U0454) At least 20 percent but less than 40 percent impaired, limited or restricted       Problem List There are no active problems to display for this patient.   Jearld Lesch., PT 2017-02-26, 5:43 PM  [facility] [place] [facility] [address] [place], [place], [address] Phone: [phone]   Fax:  [phone]  Name: Suzanne Hines MRN: [id number] Date of Birth: [birth year]

## 2017-02-10 ENCOUNTER — Encounter: Payer: Self-pay | Admitting: Physical Therapy

## 2017-02-10 ENCOUNTER — Ambulatory Visit: Payer: Federal, State, Local not specified - PPO | Admitting: Physical Therapy

## 2017-02-10 DIAGNOSIS — M5441 Lumbago with sciatica, right side: Secondary | ICD-10-CM | POA: Diagnosis not present

## 2017-02-10 DIAGNOSIS — R262 Difficulty in walking, not elsewhere classified: Secondary | ICD-10-CM

## 2017-02-10 NOTE — Therapy (Signed)
[facility] [place] [facility] [address] [place], [place], [address] Phone: [phone]   Fax:  [phone]  Physical Therapy Treatment  Patient Details  Name: Suzanne Hines MRN: [id number] Date of Birth: 07-06-1966 Referring Provider: Juanita Laster  Encounter Date: 02/10/2017      PT End of Session - 02/10/17 1636    Visit Number 2   Date for PT Re-Evaluation 04/05/17   PT Start Time 1555   PT Stop Time 1650   PT Time Calculation (min) 55 min   Activity Tolerance Patient tolerated treatment well   Behavior During Therapy [facility] for tasks assessed/performed      Past Medical History:  Diagnosis Date  . Hypertension     Past Surgical History:  Procedure Laterality Date  . ABLATION      There were no vitals filed for this visit.      Subjective Assessment - 02/10/17 1554    Subjective Pt. reports throbbing pain in back of R leg. Wasn't able to work or do anything this morning. Pt. reports pain in R leg at a 5. States that she has been doing her HEP.   Currently in Pain? Yes   Pain Score 5    Pain Location Leg   Pain Orientation Right;Lateral   Pain Descriptors / Indicators Aching;Dull                         [facility] Adult PT Treatment/Exercise - 02/10/17 0001      Exercises   Exercises Knee/Hip;Lumbar     Lumbar Exercises: Aerobic   Stationary Bike lvl 1 6 min     Lumbar Exercises: Seated   Other Seated Lumbar Exercises seated rows ( for posture)   Other Seated Lumbar Exercises 2x10     Knee/Hip Exercises: Machines for Strengthening   Cybex Knee Extension 1x10 5lbs; 1x10 10 lbs   Cybex Knee Flexion 2x10 10lbs     Knee/Hip Exercises: Standing   Hip ADduction 2 sets;10 reps   Hip ADduction Limitations 3lbs   Hip Abduction 2 sets;10 reps   Abduction Limitations 3lbs   Hip Extension 2 sets;10 reps   Extension Limitations 3lbs     Knee/Hip Exercises: Supine   Other Supine Knee/Hip Exercises  Bridges 3x10     Moist Heat Therapy   Number Minutes Moist Heat 15 Minutes   Moist Heat Location Hip     Electrical Stimulation   Electrical Stimulation Location right buttock into the HS area   Electrical Stimulation Parameters supine   Electrical Stimulation Goals Pain     Manual Therapy   Manual Therapy Passive ROM   Manual therapy comments Some passive range of motion held at end range   Passive ROM hamstring stretch/piriformis stretch                  PT Short Term Goals - 02/03/17 1645      PT SHORT TERM GOAL #1   Title independent with initial HEP   Time 2   Period Weeks   Status New           PT Long Term Goals - 02/03/17 1646      PT LONG TERM GOAL #1   Title walk all distances without assistive device   Time 8   Period Weeks   Status New     PT LONG TERM GOAL #2   Title increase strength of the right  LE to 4+/5   Time 8   Period Weeks   Status New     PT LONG TERM GOAL #3   Title decrease pain 50%   Time 8   Period Weeks   Status New     PT LONG TERM GOAL #4   Title increase SLR of the right LE to 70 degrees   Time 8   Period Weeks   Status New               Plan - 02/10/17 1638    Clinical Impression Statement Pt. was able to complete all exercises well. Required vc for correct posture and proper technique on knee/hip extension and flexion exercises. Pt. still has antalgic gait on right with SPC. Pt reported a tolerable stretch on R hamstring during exercises.    PT Frequency 2x / week   PT Duration 8 weeks   PT Treatment/Interventions ADLs/Self Care Home Management;Cryotherapy;Electrical Stimulation;Iontophoresis /ml Dexamethasone;Functional mobility training;Gait training;Stair training;Ultrasound;Traction;Moist Heat;Therapeutic exercise;Therapeutic activities;Balance training;Neuromuscular re-education;Patient/family education;Manual techniques   PT Next Visit Plan Add gym exercises, could try ionto      Patient  will benefit from skilled therapeutic intervention in order to improve the following deficits and impairments:  Abnormal gait, Decreased activity tolerance, Decreased balance, Decreased mobility, Decreased strength, Impaired flexibility, Improper body mechanics, Pain, Difficulty walking, Decreased range of motion  Visit Diagnosis: Acute right-sided low back pain with right-sided sciatica  Difficulty in walking, not elsewhere classified     Problem List There are no active problems to display for this patient.   Forde Radon, SPTA 02/10/2017, 4:43 PM  [facility] [place] [facility] [address] [place], [place], [address] Phone: [phone]   Fax:  [phone]  Name: Suzanne Hines MRN: [id number] Date of Birth: 1966/05/23

## 2017-02-11 DIAGNOSIS — F411 Generalized anxiety disorder: Secondary | ICD-10-CM | POA: Diagnosis not present

## 2017-02-11 DIAGNOSIS — F4310 Post-traumatic stress disorder, unspecified: Secondary | ICD-10-CM | POA: Diagnosis not present

## 2017-02-12 ENCOUNTER — Ambulatory Visit: Payer: Federal, State, Local not specified - PPO | Admitting: Physical Therapy

## 2017-02-12 ENCOUNTER — Encounter: Payer: Self-pay | Admitting: Physical Therapy

## 2017-02-12 DIAGNOSIS — M5441 Lumbago with sciatica, right side: Secondary | ICD-10-CM

## 2017-02-12 DIAGNOSIS — R262 Difficulty in walking, not elsewhere classified: Secondary | ICD-10-CM

## 2017-02-12 NOTE — Therapy (Signed)
[facility] [address] [place], [place], [address] Phone: [phone]   Fax:  [phone]  Physical Therapy Treatment  Patient Details  Name: Suzanne Hines MRN: [id number] Date of Birth: Aug 24, 1966 Referring Provider: Juanita Laster  Encounter Date: 02/12/2017      PT End of Session - 02/12/17 1009    Visit Number 3   Date for PT Re-Evaluation 04/05/17   PT Start Time 0847   PT Stop Time 0950   PT Time Calculation (min) 63 min   Activity Tolerance Patient tolerated treatment well   Behavior During Therapy [facility] for tasks assessed/performed      Past Medical History:  Diagnosis Date  . Hypertension     Past Surgical History:  Procedure Laterality Date  . ABLATION      There were no vitals filed for this visit.      Subjective Assessment - 02/12/17 0851    Subjective Patient reports feeling better today.  She is still having pain in the HS area.  Reports that she is walking better today.   Currently in Pain? Yes   Pain Score 4    Pain Location Buttocks   Pain Orientation Right   Pain Descriptors / Indicators Aching;Dull                         [facility] Adult PT Treatment/Exercise - 02/12/17 0001      Lumbar Exercises: Stretches   Passive Hamstring Stretch 4 reps;20 seconds   Piriformis Stretch 4 reps;30 seconds     Lumbar Exercises: Aerobic   Stationary Bike lvl 1 6 min   Elliptical Nustep level 4 x 5 minutes     Lumbar Exercises: Machines for Strengthening   Cybex Knee Extension 5# 2x10   Cybex Knee Flexion 15# 2x10     Lumbar Exercises: Seated   Other Seated Lumbar Exercises seated rows, lats 20# 2x10 each     Lumbar Exercises: Supine   Other Supine Lumbar Exercises supine feet on ball K2C, trunk rotation and very small bridges, isometric abdominals     Knee/Hip Exercises: Standing   Hip Abduction 2 sets;10 reps   Abduction Limitations 3lbs   Hip Extension 2 sets;10  reps   Extension Limitations 3lbs     Modalities   Modalities Iontophoresis     Moist Heat Therapy   Number Minutes Moist Heat 15 Minutes   Moist Heat Location Hip     Electrical Stimulation   Electrical Stimulation Location right buttock into the HS area   Electrical Stimulation Action IFC   Electrical Stimulation Parameters supine   Electrical Stimulation Goals Pain     Iontophoresis   Type of Iontophoresis Dexamethasone   Location right HS origin   Dose 80mA   Time 4 hour patch #1                  PT Short Term Goals - 02/12/17 1010      PT SHORT TERM GOAL #1   Title independent with initial HEP   Status Achieved           PT Long Term Goals - 02/03/17 1646      PT LONG TERM GOAL #1   Title walk all distances without assistive device   Time 8   Period Weeks   Status New     PT LONG TERM GOAL #2   Title increase strength of the  right LE to 4+/5   Time 8   Period Weeks   Status New     PT LONG TERM GOAL #3   Title decrease pain 50%   Time 8   Period Weeks   Status New     PT LONG TERM GOAL #4   Title increase SLR of the right LE to 70 degrees   Time 8   Period Weeks   Status New               Plan - 02/12/17 1009    Clinical Impression Statement Patient moving better today, still very gaurded with all motions and walking.  Seems very timid to move, but did tolerate all exercises with minimal c/o pain, some pain with knee flexion and bridges   PT Next Visit Plan see if she felt ionto helped   Consulted and Agree with Plan of Care Patient      Patient will benefit from skilled therapeutic intervention in order to improve the following deficits and impairments:  Abnormal gait, Decreased activity tolerance, Decreased balance, Decreased mobility, Decreased strength, Impaired flexibility, Improper body mechanics, Pain, Difficulty walking, Decreased range of motion  Visit Diagnosis: Acute right-sided low back pain with right-sided  sciatica  Difficulty in walking, not elsewhere classified     Problem List There are no active problems to display for this patient.   Jearld Lesch., PT 02/12/2017, 10:11 AM  [facility] [address] [place], [place], [address] Phone: [phone]   Fax:  [phone]  Name: Suzanne Hines MRN: [id number] Date of Birth: 1966/02/07

## 2017-02-17 ENCOUNTER — Ambulatory Visit: Payer: Federal, State, Local not specified - PPO | Admitting: Physical Therapy

## 2017-02-17 ENCOUNTER — Encounter: Payer: Self-pay | Admitting: Physical Therapy

## 2017-02-17 DIAGNOSIS — R262 Difficulty in walking, not elsewhere classified: Secondary | ICD-10-CM | POA: Diagnosis not present

## 2017-02-17 DIAGNOSIS — M5441 Lumbago with sciatica, right side: Secondary | ICD-10-CM | POA: Diagnosis not present

## 2017-02-17 NOTE — Therapy (Signed)
[facility] [address] [place], [place], [address] Phone: [phone]   Fax:  [phone]  Physical Therapy Treatment  Patient Details  Name: Suzanne Hines MRN: [id number] Date of Birth: 1965-12-12 Referring Provider: Juanita Laster  Encounter Date: 02/17/2017      PT End of Session - 02/17/17 1554    Visit Number 4   Date for PT Re-Evaluation 04/05/17   PT Start Time 1525   PT Stop Time 1624   PT Time Calculation (min) 59 min   Activity Tolerance Patient tolerated treatment well   Behavior During Therapy [facility] for tasks assessed/performed      Past Medical History:  Diagnosis Date  . Hypertension     Past Surgical History:  Procedure Laterality Date  . ABLATION      There were no vitals filed for this visit.      Subjective Assessment - 02/17/17 1528    Subjective Reports still getting better, but a little sore as the day goes on.   Currently in Pain? Yes   Pain Score 5    Pain Location Buttocks   Pain Orientation Right   Aggravating Factors  as the day goes on                         [facility] Adult PT Treatment/Exercise - 02/17/17 0001      High Level Balance   High Level Balance Comments resisted gait all directions     Lumbar Exercises: Stretches   Passive Hamstring Stretch 4 reps;20 seconds   Piriformis Stretch 4 reps;30 seconds     Lumbar Exercises: Aerobic   Stationary Bike lvl 1 6 min   Elliptical Nustep level 4 x 5 minutes     Lumbar Exercises: Machines for Strengthening   Cybex Knee Extension 5# 2x10   Cybex Knee Flexion 15# 2x10     Lumbar Exercises: Seated   Other Seated Lumbar Exercises seated rows, lats 20# 2x10 each     Lumbar Exercises: Supine   Other Supine Lumbar Exercises supine feet on ball K2C, trunk rotation and very small bridges, isometric abdominals     Knee/Hip Exercises: Standing   Hip Abduction 2 sets;10 reps   Abduction Limitations 3lbs   Hip Extension 2 sets;10 reps   Extension Limitations 3lbs     Moist Heat Therapy   Number Minutes Moist Heat 15 Minutes   Moist Heat Location Hip     Electrical Stimulation   Electrical Stimulation Location right buttock into the HS area   Electrical Stimulation Action IFC   Electrical Stimulation Parameters supine   Electrical Stimulation Goals Pain                  PT Short Term Goals - 02/12/17 1010      PT SHORT TERM GOAL #1   Title independent with initial HEP   Status Achieved           PT Long Term Goals - 02/17/17 1556      PT LONG TERM GOAL #1   Title walk all distances without assistive device   Status On-going               Plan - 02/17/17 1555    Clinical Impression Statement Patient still timid and fearful of motions, reports that she feels like it may give out.   PT Next Visit Plan she had to go somewhere this  PM and needed to shower so we did not do the ionto, next visit may write MD note as she will see him on Friday   Consulted and Agree with Plan of Care Patient      Patient will benefit from skilled therapeutic intervention in order to improve the following deficits and impairments:  Abnormal gait, Decreased activity tolerance, Decreased balance, Decreased mobility, Decreased strength, Impaired flexibility, Improper body mechanics, Pain, Difficulty walking, Decreased range of motion  Visit Diagnosis: Acute right-sided low back pain with right-sided sciatica  Difficulty in walking, not elsewhere classified     Problem List There are no active problems to display for this patient.   Jearld Lesch., PT 02/17/2017, 3:57 PM  [facility] [place] [facility] [address] [place], [place], [address] Phone: [phone]   Fax:  [phone]  Name: Suzanne Hines MRN: [id number] Date of Birth: 1965-11-26

## 2017-02-19 ENCOUNTER — Encounter: Payer: Self-pay | Admitting: Physical Therapy

## 2017-02-19 ENCOUNTER — Ambulatory Visit: Payer: Federal, State, Local not specified - PPO | Admitting: Physical Therapy

## 2017-02-19 DIAGNOSIS — R262 Difficulty in walking, not elsewhere classified: Secondary | ICD-10-CM

## 2017-02-19 DIAGNOSIS — M5441 Lumbago with sciatica, right side: Secondary | ICD-10-CM | POA: Diagnosis not present

## 2017-02-19 NOTE — Therapy (Signed)
[facility] [address] [place], [place], [address] Phone: [phone]   Fax:  [phone]  Physical Therapy Treatment  Patient Details  Name: Suzanne Hines MRN: [id number] Date of Birth: July 07, 1966 Referring Provider: Juanita Laster  Encounter Date: 12-06-2016      PT End of Session - 02/19/17 1754    Visit Number 5   Date for PT Re-Evaluation 04/05/17   PT Start Time 1655   PT Stop Time 1755   PT Time Calculation (min) 60 min   Activity Tolerance Patient tolerated treatment well   Behavior During Therapy [facility] for tasks assessed/performed      Past Medical History:  Diagnosis Date  . Hypertension     Past Surgical History:  Procedure Laterality Date  . ABLATION      There were no vitals filed for this visit.      Subjective Assessment - 02/19/17 1700    Subjective Patient reports she is feeling tired by this time in the day. However she's had a very good day as far as pain.    Currently in Pain? Yes   Pain Score 4    Pain Location Buttocks   Pain Orientation Right   Pain Descriptors / Indicators Aching;Dull                         [facility] Adult PT Treatment/Exercise - 02/19/17 0001      Lumbar Exercises: Stretches   Passive Hamstring Stretch 4 reps;20 seconds   ITB Stretch 3 reps;30 seconds   Piriformis Stretch 4 reps;30 seconds     Lumbar Exercises: Aerobic   Stationary Bike lvl 1 6 min   Elliptical Nustep level 4 x 6 minutes     Lumbar Exercises: Machines for Strengthening   Cybex Knee Extension 5# 2x10   Cybex Knee Flexion 15# 2x10     Knee/Hip Exercises: Standing   Other Standing Knee Exercises Lateral red tband walking x2, marching over foam rolls x 3    Other Standing Knee Exercises all hip directions on airex 2.5# 2 x10      Iontophoresis   Type of Iontophoresis Dexamethasone   Location right HS origin   Dose 80mA   Time 4 hour patch #1                   PT Short Term Goals - 02/12/17 1010      PT SHORT TERM GOAL #1   Title independent with initial HEP   Status Achieved           PT Long Term Goals - 02/17/17 1556      PT LONG TERM GOAL #1   Title walk all distances without assistive device   Status On-going               Plan - 02/19/17 1754    Clinical Impression Statement The paitent stated she was feeling tired this afternoon but was having a good day with the pain. She handled all the exercises very well. The patient was able to walk around without her cane and able to incoperate a step through instead of steping through without any assisance, however she did state that she was not as scared because she knew someone was right there. Marching over the foam rolls with min assist the patient was able to do it without having to stop after each one again because she knew someone  was right there.  The patient still needed cues to stand up straight during the motions on the airex and keeping her toes forward during lateral band walks.    PT Next Visit Plan Keep working on gait with the step through to normalize gait and strengthening.       Patient will benefit from skilled therapeutic intervention in order to improve the following deficits and impairments:  Abnormal gait, Decreased activity tolerance, Decreased balance, Decreased mobility, Decreased strength, Impaired flexibility, Improper body mechanics, Pain, Difficulty walking, Decreased range of motion  Visit Diagnosis: Acute right-sided low back pain with right-sided sciatica  Difficulty in walking, not elsewhere classified     Problem List There are no active problems to display for this patient.   Suzanne Hines 02/19/2017, 5:59 PM  [facility] [place] [facility] [address] [place], [place], [address] Phone: [phone]   Fax:  [phone]  Name: Suzanne Hines MRN: [id number] Date  of Birth: 1966/10/21

## 2017-02-20 DIAGNOSIS — M62830 Muscle spasm of back: Secondary | ICD-10-CM | POA: Diagnosis not present

## 2017-02-20 DIAGNOSIS — M5416 Radiculopathy, lumbar region: Secondary | ICD-10-CM | POA: Diagnosis not present

## 2017-02-20 DIAGNOSIS — S76311S Strain of muscle, fascia and tendon of the posterior muscle group at thigh level, right thigh, sequela: Secondary | ICD-10-CM | POA: Diagnosis not present

## 2017-02-20 DIAGNOSIS — M533 Sacrococcygeal disorders, not elsewhere classified: Secondary | ICD-10-CM | POA: Diagnosis not present

## 2017-02-24 ENCOUNTER — Ambulatory Visit: Payer: Federal, State, Local not specified - PPO | Admitting: Physical Therapy

## 2017-02-25 ENCOUNTER — Ambulatory Visit: Payer: Federal, State, Local not specified - PPO | Admitting: Physical Therapy

## 2017-02-25 DIAGNOSIS — M5441 Lumbago with sciatica, right side: Secondary | ICD-10-CM

## 2017-02-25 DIAGNOSIS — R262 Difficulty in walking, not elsewhere classified: Secondary | ICD-10-CM | POA: Diagnosis not present

## 2017-02-25 NOTE — Therapy (Signed)
[place] [address] [place], [place], [address] Phone: [phone]   Fax:  [phone]  Physical Therapy Treatment  Patient Details  Name: Suzanne Hines MRN: [id number] Date of Birth: 18-Aug-1966 Referring Provider: Tamala Julian  Encounter Date: 02/25/2017      PT End of Session - 02/25/17 0809    Visit Number 6   Date for PT Re-Evaluation 04/05/17   PT Start Time 0805  Pt. arriving late    PT Stop Time 0849   PT Time Calculation (min) 44 min   Activity Tolerance Patient tolerated treatment well   Behavior During Therapy [facility] for tasks assessed/performed      Past Medical History:  Diagnosis Date  . Hypertension     Past Surgical History:  Procedure Laterality Date  . ABLATION      There were no vitals filed for this visit.      Subjective Assessment - 02/25/17 0807    Subjective Pt. reporting she feels ionto patch is helping.     Patient Stated Goals walk and have less pain   Currently in Pain? Yes   Pain Score 4    Pain Location Buttocks   Pain Orientation Right   Pain Descriptors / Indicators Aching;Dull   Pain Type Acute pain   Pain Onset More than a month ago   Pain Frequency Constant   Aggravating Factors  Prolonged sitting,    Multiple Pain Sites No                         [facility] Adult PT Treatment/Exercise - 02/25/17 0825      Ambulation/Gait   Ambulation/Gait Yes   Ambulation/Gait Assistance 6: Modified independent (Device/Increase time)   Ambulation Distance (Feet) 400 Feet   Assistive device Straight cane;None   Pre-Gait Activities black bolster step-over 4 x 5 laps down back; working on step through pattern and heel strike   Gait Comments Worked on sequencing with SPC and step through pattern without AD; pt. requiring cueing for even step length and wt. shift however able to ambulate with increased confidence and step length without AD; no significant rise in  pain following      Lumbar Exercises: Machines for Strengthening   Cybex Knee Extension 10# 2 x 10 reps   Cybex Knee Flexion 15# 2 x 15 reps      Knee/Hip Exercises: Aerobic   Nustep NuStep: lvl 4, 6 min      Knee/Hip Exercises: Standing   Hip Flexion 1 set;10 reps;Knee straight;Right;Left   Hip Flexion Limitations 3# at TM    Hip Abduction 10 reps;1 set;Right;Left   Abduction Limitations 3lbs; at TM   Hip Extension 10 reps;1 set;Right;Left   Extension Limitations 3lbs; at TM                   PT Short Term Goals - 02/12/17 1010      PT SHORT TERM GOAL #1   Title independent with initial HEP   Status Achieved           PT Long Term Goals - 02/25/17 2542      PT LONG TERM GOAL #1   Title walk all distances without assistive device   Status On-going     PT LONG TERM GOAL #2   Title increase strength of the right LE to 4+/5   Status Partially Met     PT LONG  TERM GOAL #3   Title decrease pain 50%   Status Achieved  4.25.18: pt. noting 70% improvement     PT LONG TERM GOAL #4   Title increase SLR of the right LE to 70 degrees   Status On-going               Plan - 02/25/17 0857    Clinical Impression Statement Pt. reporting R LE, "loosening" up with treatment today with pain decreasing throughout therex and gait.  Gait training focusing on step-through with even wt. shfit without AD.  Pt. able to demo stable step-through pattern however still apprehensive with increased step length.  Some progression in strengthening activity today.  Good benefit noted from ionto patch.  Pt. to return to therapy tomorrow with possible application of ionto.     PT Treatment/Interventions ADLs/Self Care Home Management;Cryotherapy;Electrical Stimulation;Iontophoresis 36m/ml Dexamethasone;Functional mobility training;Gait training;Stair training;Ultrasound;Traction;Moist Heat;Therapeutic exercise;Therapeutic activities;Balance training;Neuromuscular  re-education;Patient/family education;Manual techniques   PT Next Visit Plan Continue step-through gait without AD as pt. able; strengthening       Patient will benefit from skilled therapeutic intervention in order to improve the following deficits and impairments:  Abnormal gait, Decreased activity tolerance, Decreased balance, Decreased mobility, Decreased strength, Impaired flexibility, Improper body mechanics, Pain, Difficulty walking, Decreased range of motion  Visit Diagnosis: Acute right-sided low back pain with right-sided sciatica  Difficulty in walking, not elsewhere classified     Problem List There are no active problems to display for this patient.   MBess Harvest PTA 02/25/17 12:13 PM    [place] [place] [address] [address]Phone: [phone]  Fax:  [phone] Name: MMARLAYNA BANNISTERMRN: [id number] of Birth: [birth year]

## 2017-02-26 ENCOUNTER — Encounter: Payer: Self-pay | Admitting: Physical Therapy

## 2017-02-26 ENCOUNTER — Ambulatory Visit: Payer: Federal, State, Local not specified - PPO | Admitting: Physical Therapy

## 2017-02-26 DIAGNOSIS — M5441 Lumbago with sciatica, right side: Secondary | ICD-10-CM | POA: Diagnosis not present

## 2017-02-26 DIAGNOSIS — R262 Difficulty in walking, not elsewhere classified: Secondary | ICD-10-CM

## 2017-02-26 NOTE — Therapy (Signed)
[place], [place], [address] Phone: [phone]   Fax:  [phone]  Physical Therapy Treatment  Patient Details  Name: Suzanne Hines MRN: [id number] Date of Birth: 06/12/1966 Referring Provider: Tamala Julian  Encounter Date: 02/26/2017      PT End of Session - 02/26/17 1600    Visit Number 7   Date for PT Re-Evaluation 04/05/17   PT Start Time 8546   PT Stop Time 1615   PT Time Calculation (min) 54 min   Activity Tolerance Patient tolerated treatment well   Behavior During Therapy [facility] for tasks assessed/performed      Past Medical History:  Diagnosis Date  . Hypertension     Past Surgical History:  Procedure Laterality Date  . ABLATION      There were no vitals filed for this visit.      Subjective Assessment - 02/26/17 1528    Subjective Patient reports that she is sore from yesterday and by this time of the day she is more tired.    Currently in Pain? Yes   Pain Score 5    Pain Location Buttocks   Pain Orientation Right   Pain Descriptors / Indicators Aching;Dull                         [facility] Adult PT Treatment/Exercise - 02/26/17 0001      Lumbar Exercises: Aerobic   Elliptical Nustep level 4 x 6 minutes     Lumbar Exercises: Machines for Strengthening   Cybex Knee Extension 10# 2 x 10 reps   Cybex Knee Flexion 15# 2 x 15 reps      Knee/Hip Exercises: Standing   Other Standing Knee Exercises marching forward and lateral over foam rolls x 3    Other Standing Knee Exercises all hip directions on airex 2.5# 2 x10      Modalities   Modalities Electrical Stimulation;Iontophoresis;Cryotherapy     Cryotherapy   Number Minutes Cryotherapy 15 Minutes   Cryotherapy Location Other (comment)  HS   Type of Cryotherapy Ice pack     Electrical Stimulation   Electrical Stimulation Location right buttock into the HS area   Electrical Stimulation Action  IFC   Electrical Stimulation Parameters Supine   Electrical Stimulation Goals Pain     Iontophoresis   Type of Iontophoresis Dexamethasone   Location right HS origin   Dose 48m   Time 4 hour patch #1                  PT Short Term Goals - 02/12/17 1010      PT SHORT TERM GOAL #1   Title independent with initial HEP   Status Achieved           PT Long Term Goals - 02/25/17 02703     PT LONG TERM GOAL #1   Title walk all distances without assistive device   Status On-going     PT LONG TERM GOAL #2   Title increase strength of the right LE to 4+/5   Status Partially Met     PT LONG TERM GOAL #3   Title decrease pain 50%   Status Achieved  4.25.18: pt. noting 70% improvement     PT LONG TERM GOAL #4   Title increase SLR of the right LE to 70 degrees   Status On-going  Plan - 02/26/17 1602    Clinical Impression Statement Pt reported that she was sore from yesterday's session. With marching over the foam rolls we focused on her doing it without any assistance even though she did need to regain her balance x 2 doing lateral step overs. Did not do as many exercises today due to soreness from yesterday. The patient stated she is fearful of stepping over things.    PT Next Visit Plan Cont strengthening and gait without AD.      Patient will benefit from skilled therapeutic intervention in order to improve the following deficits and impairments:  Abnormal gait, Decreased activity tolerance, Decreased balance, Decreased mobility, Decreased strength, Impaired flexibility, Improper body mechanics, Pain, Difficulty walking, Decreased range of motion  Visit Diagnosis: Acute right-sided low back pain with right-sided sciatica  Difficulty in walking, not elsewhere classified     Problem List There are no active problems to display for this patient.   Alan Mulder SPTA 02/26/2017, 4:18 PM  [place] [address] [place], [place], [address] Phone: [phone]   Fax:  [phone]  Name: Suzanne Hines MRN: [id number] Date of Birth: 07/31/1966

## 2017-03-03 ENCOUNTER — Encounter: Payer: Self-pay | Admitting: Physical Therapy

## 2017-03-03 ENCOUNTER — Ambulatory Visit: Payer: Federal, State, Local not specified - PPO | Attending: Family Medicine | Admitting: Physical Therapy

## 2017-03-03 DIAGNOSIS — M5441 Lumbago with sciatica, right side: Secondary | ICD-10-CM

## 2017-03-03 DIAGNOSIS — R262 Difficulty in walking, not elsewhere classified: Secondary | ICD-10-CM | POA: Diagnosis not present

## 2017-03-03 NOTE — Therapy (Signed)
[address] [place], [place], [address] Phone: [phone]   Fax:  [phone]  Physical Therapy Treatment  Patient Details  Name: Suzanne Hines MRN: [id number] Date of Birth: [birth year] Referring Provider: Tamala Julian  Encounter Date: 03/03/2017      PT End of Session - 03/03/17 1649    Visit Number 8   PT Start Time 1600   PT Stop Time 7035   PT Time Calculation (min) 54 min   Activity Tolerance Patient tolerated treatment well   Behavior During Therapy [facility] for tasks assessed/performed      Past Medical History:  Diagnosis Date  . Hypertension     Past Surgical History:  Procedure Laterality Date  . ABLATION      There were no vitals filed for this visit.      Subjective Assessment - 03/03/17 1602    Subjective Pt reports some pain in right HS. Also reported that she was very sore and had cramps in both calfs over the weekend. Reports that her calfs are still tender to touch.    Pain Score 3    Pain Location Buttocks                         [facility] Adult PT Treatment/Exercise - 03/03/17 0001      Ambulation/Gait   Ambulation/Gait Yes   Assistive device None   Ambulation Surface Indoor;Outdoor;Paved   Gait Comments walked around building   shortend step length      Lumbar Exercises: Aerobic   Elliptical Nustep level 4 x 6 minutes     Lumbar Exercises: Machines for Strengthening   Cybex Knee Extension 10# 2 x 15 reps   Cybex Knee Flexion 15# 2 x 15 reps    Leg Press 30lb 2x10   Other Lumbar Machine Exercise 20lb 2x10 RLE only     Modalities   Modalities Electrical Stimulation;Iontophoresis;Cryotherapy     Cryotherapy   Number Minutes Cryotherapy 15 Minutes   Cryotherapy Location --  HS   Type of Cryotherapy Ice pack     Electrical Stimulation   Electrical Stimulation Location right buttock into the HS area   Electrical Stimulation Action IFC    Electrical Stimulation Parameters supine   Electrical Stimulation Goals Pain     Iontophoresis   Type of Iontophoresis Dexamethasone   Location right HS origin   Dose 41m   Time 4 hour patch #3                  PT Short Term Goals - 02/12/17 1010      PT SHORT TERM GOAL #1   Title independent with initial HEP   Status Achieved           PT Long Term Goals - 03/03/17 1651      PT LONG TERM GOAL #1   Title walk all distances without assistive device   Status Partially Met               Plan - 03/03/17 1650    Clinical Impression Statement Pt reported some tenderness in calfs today from last weeks session. Pt tolerated all strengthening exercises well. Pt ambulated around building without AD independently. VC for increased step length. Pt reported that she had a fear of tripping and falling on gravel or other things on the ground. Reported increased tightness in HS going up hill.   Rehab  Potential Good   PT Frequency 2x / week   PT Duration 8 weeks   PT Treatment/Interventions ADLs/Self Care Home Management;Cryotherapy;Electrical Stimulation;Iontophoresis 26m/ml Dexamethasone;Functional mobility training;Gait training;Stair training;Ultrasound;Traction;Moist Heat;Therapeutic exercise;Therapeutic activities;Balance training;Neuromuscular re-education;Patient/family education;Manual techniques   PT Next Visit Plan Cont strengthening and gait without AD.   Consulted and Agree with Plan of Care Patient      Patient will benefit from skilled therapeutic intervention in order to improve the following deficits and impairments:  Abnormal gait, Decreased activity tolerance, Decreased balance, Decreased mobility, Decreased strength, Impaired flexibility, Improper body mechanics, Pain, Difficulty walking, Decreased range of motion  Visit Diagnosis: Acute right-sided low back pain with right-sided sciatica  Difficulty in walking, not elsewhere  classified     Problem List There are no active problems to display for this patient.   SOctavia Bruckner5/11/2016, 5:00 PM  [address] [address]Phone: [phone]  Fax:  [phone] Name: Suzanne ORDAZMRN: [id number] of Birth: 1April 14, 1967

## 2017-03-05 ENCOUNTER — Ambulatory Visit: Payer: Federal, State, Local not specified - PPO | Admitting: Rehabilitation

## 2017-03-05 ENCOUNTER — Encounter: Payer: Self-pay | Admitting: Rehabilitation

## 2017-03-05 DIAGNOSIS — R262 Difficulty in walking, not elsewhere classified: Secondary | ICD-10-CM

## 2017-03-05 DIAGNOSIS — M5441 Lumbago with sciatica, right side: Secondary | ICD-10-CM

## 2017-03-05 NOTE — Therapy (Signed)
[place], [place], [address] Phone: [phone]   Fax:  [phone]  Physical Therapy Treatment  Patient Details  Name: Suzanne Hines MRN: [id number] Date of Birth: [birth year] Referring Provider: Tamala Julian  Encounter Date: 03/05/2017      PT End of Session - 03/05/17 1559    Visit Number 9   Date for PT Re-Evaluation 04/05/17   PT Start Time 1530   PT Stop Time 1630   PT Time Calculation (min) 60 min   Activity Tolerance Patient tolerated treatment well      Past Medical History:  Diagnosis Date  . Hypertension     Past Surgical History:  Procedure Laterality Date  . ABLATION      There were no vitals filed for this visit.      Subjective Assessment - 03/05/17 1530    Subjective Walks in with [facility] today.  Reports still fearful of falling out in the parking lot.  Reports things are getting better overall   Currently in Pain? Yes   Pain Score 3    Pain Location Buttocks   Pain Orientation Right            [facility] PT Assessment - 03/05/17 0001      AROM   Overall AROM Comments Lumbar AROM limited and painful in flexion only decreased by 15%   Right Hip Flexion 60  with tightness in ham   Right Hip External Rotation  20  tightness     Flexibility   Soft Tissue Assessment /Muscle Length yes   Hamstrings 45  worse with DF   ITB tightness     Palpation   Palpation comment no sig ttp piriformis today; 1 ttp to R lateral glute     Special Tests    Special Tests Lumbar   Lumbar Tests Slump Test     Slump test   Findings Positive   Side Right   Comment pos for centralization of the cspine                     [facility] Adult PT Treatment/Exercise - 03/05/17 0001      Lumbar Exercises: Aerobic   Elliptical Nustep level 4 x 6 minutes     Lumbar Exercises: Machines for Strengthening   Cybex Knee Extension 10# 2 x 15 reps   Cybex Knee Flexion 15# 2 x  15 reps    Leg Press 30lb 2x10   Other Lumbar Machine Exercise 20lb 2x10 RLE only     Knee/Hip Exercises: Supine   Other Supine Knee/Hip Exercises sciatic nerve flossing with DF x 10 just before resistance     Cryotherapy   Number Minutes Cryotherapy 15 Minutes   Type of Cryotherapy Ice pack     Iontophoresis   Type of Iontophoresis Dexamethasone   Location right HS origin   Dose 81m   Time 4 hour patch #5                PT Education - 03/05/17 1559    Education provided Yes   Education Details review of hamstring strap stretch   Person(s) Educated Patient   Methods Explanation;Demonstration;Handout   Comprehension Verbalized understanding;Returned demonstration;Verbal cues required          PT Short Term Goals - 02/12/17 1010      PT SHORT TERM GOAL #1   Title independent with initial HEP   Status Achieved  PT Long Term Goals - 03/05/17 1608      PT LONG TERM GOAL #1   Title walk all distances without assistive device   Status Partially Met     PT LONG TERM GOAL #2   Title increase strength of the right LE to 4+/5   Status Partially Met     PT LONG TERM GOAL #3   Title decrease pain 50%   Status Achieved     PT LONG TERM GOAL #4   Title increase SLR of the right LE to 70 degrees   Status Not Met               Plan - 03/05/17 1600    Clinical Impression Statement 10th visit note/recheck performed today.  lumbar ROM has improved as well as tenderness to palpation.  Pt exhibiting positive R slump test and sciatic nerve tension today.  Performed sciatic nerve flossing 1 set of 10 for trial today and re-emphasized hamstring stretch for home. Pt declined IFC today    PT Frequency 2x / week   PT Duration 8 weeks   PT Treatment/Interventions ADLs/Self Care Home Management;Cryotherapy;Electrical Stimulation;Iontophoresis 101m/ml Dexamethasone;Functional mobility training;Gait training;Stair training;Ultrasound;Traction;Moist  Heat;Therapeutic exercise;Therapeutic activities;Balance training;Neuromuscular re-education;Patient/family education;Manual techniques   PT Next Visit Plan Ask about tolerance to sciatic nerve flossing; add if tolerated well add to HEP.  Cont strengthening and gait without AD.      Patient will benefit from skilled therapeutic intervention in order to improve the following deficits and impairments:  Abnormal gait, Decreased activity tolerance, Decreased balance, Decreased mobility, Decreased strength, Impaired flexibility, Improper body mechanics, Pain, Difficulty walking, Decreased range of motion  Visit Diagnosis: Acute right-sided low back pain with right-sided sciatica  Difficulty in walking, not elsewhere classified     Problem List There are no active problems to display for this patient.   TStark Bray5/01/2017, 5:28 PM  [address] [address]Phone: [phone]  Fax:  [phone] Name: Suzanne LANPHERMRN: [id number] of Birth: [birth year]

## 2017-03-10 ENCOUNTER — Ambulatory Visit: Payer: Federal, State, Local not specified - PPO | Admitting: Physical Therapy

## 2017-03-12 ENCOUNTER — Encounter: Payer: Self-pay | Admitting: Physical Therapy

## 2017-03-12 ENCOUNTER — Ambulatory Visit: Payer: Federal, State, Local not specified - PPO | Admitting: Physical Therapy

## 2017-03-12 DIAGNOSIS — R262 Difficulty in walking, not elsewhere classified: Secondary | ICD-10-CM | POA: Diagnosis not present

## 2017-03-12 DIAGNOSIS — M5441 Lumbago with sciatica, right side: Secondary | ICD-10-CM | POA: Diagnosis not present

## 2017-03-12 NOTE — Therapy (Signed)
[place], [place], [address] Phone: [phone]   Fax:  [phone]  Physical Therapy Treatment  Patient Details  Name: Suzanne Hines MRN: [id number] Date of Birth: 04/17/1966 Referring Provider: Tamala Julian  Encounter Date: 03/12/2017      PT End of Session - 03/12/17 1640    Visit Number 10   Date for PT Re-Evaluation 04/05/17   PT Start Time 3220   PT Stop Time 2542   PT Time Calculation (min) 60 min      Past Medical History:  Diagnosis Date  . Hypertension     Past Surgical History:  Procedure Laterality Date  . ABLATION      There were no vitals filed for this visit.      Subjective Assessment - 03/12/17 1611    Subjective no cane since saturday, trying to get over fear. 70% better but pain gets worse as day goes on. amb with RT leg out and minimal knee flexion   Currently in Pain? Yes   Pain Score 3    Pain Location Buttocks                         [facility] Adult PT Treatment/Exercise - 03/12/17 0001      Lumbar Exercises: Aerobic   Elliptical Nustep L 5 6 min LE only     Lumbar Exercises: Machines for Strengthening   Cybex Knee Extension 10# 2 x 15 reps   Cybex Knee Flexion 15# 2 x 15 reps    Leg Press 30lb 2x10   Other Lumbar Machine Exercise 20lb 2x10 RLE only     Lumbar Exercises: Standing   Other Standing Lumbar Exercises 5# dead lifts  10     Lumbar Exercises: Supine   Bridge 15 reps;3 seconds  with ball   Straight Leg Raise 10 reps  RT leg on ball   Other Supine Lumbar Exercises obl with ball alt 20   Other Supine Lumbar Exercises blue tband leg press 20 times     Cryotherapy   Number Minutes Cryotherapy 12 Minutes   Type of Cryotherapy Ice pack     Iontophoresis   Type of Iontophoresis Dexamethasone   Location right HS origin   Dose 32m   Time 4 hour patch #6                  PT Short Term Goals - 02/12/17 1010       PT SHORT TERM GOAL #1   Title independent with initial HEP   Status Achieved           PT Long Term Goals - 03/12/17 1639      PT LONG TERM GOAL #1   Title walk all distances without assistive device   Status Partially Met     PT LONG TERM GOAL #2   Title increase strength of the right LE to 4+/5   Status Partially Met     PT LONG TERM GOAL #3   Title decrease pain 50%   Status Achieved     PT LONG TERM GOAL #4   Title increase SLR of the right LE to 70 degrees   Status Achieved               Plan - 03/12/17 1641    Clinical Impression Statement SLR and pain goal met. progressing with strength goal but did have increased  pain and fatigue with added wt and ex today. ambulting without SPC but deviations noted.   PT Next Visit Plan increase strength and progres gait      Patient will benefit from skilled therapeutic intervention in order to improve the following deficits and impairments:  Abnormal gait, Decreased activity tolerance, Decreased balance, Decreased mobility, Decreased strength, Impaired flexibility, Improper body mechanics, Pain, Difficulty walking, Decreased range of motion  Visit Diagnosis: Acute right-sided low back pain with right-sided sciatica  Difficulty in walking, not elsewhere classified     Problem List There are no active problems to display for this patient.   PAYSEUR,ANGIE PTA 03/12/2017, 4:42 PM  [address] [place], [place], [address] Phone: [phone]   Fax:  [phone]  Name: Suzanne Hines MRN: [id number] Date of Birth: [birth year]

## 2017-03-18 ENCOUNTER — Ambulatory Visit: Payer: Federal, State, Local not specified - PPO | Admitting: Physical Therapy

## 2017-03-18 ENCOUNTER — Encounter: Payer: Self-pay | Admitting: Physical Therapy

## 2017-03-18 DIAGNOSIS — R262 Difficulty in walking, not elsewhere classified: Secondary | ICD-10-CM | POA: Diagnosis not present

## 2017-03-18 DIAGNOSIS — M5441 Lumbago with sciatica, right side: Secondary | ICD-10-CM | POA: Diagnosis not present

## 2017-03-18 NOTE — Therapy (Signed)
[place], [place], [address] Phone: [phone]   Fax:  [phone]  Physical Therapy Treatment  Patient Details  Name: BRODIE SCOVELL MRN: [id number] Date of Birth: 06-07-1966 Referring Provider: Tamala Julian  Encounter Date: 03/18/2017      PT End of Session - 03/18/17 1636    Visit Number 11   Date for PT Re-Evaluation 04/05/17   PT Start Time 1600   PT Stop Time 1645   PT Time Calculation (min) 45 min      Past Medical History:  Diagnosis Date  . Hypertension     Past Surgical History:  Procedure Laterality Date  . ABLATION      There were no vitals filed for this visit.      Subjective Assessment - 03/18/17 1600    Subjective Pt reports no change since last treatment, She reports using the cane less and less   Currently in Pain? Yes   Pain Score 4    Pain Location Buttocks            [facility] PT Assessment - 03/18/17 0001      AROM   Overall AROM Comments Lumbar AROM limited and painful in flexion only decreased by 15%                     [facility] Adult PT Treatment/Exercise - 03/18/17 0001      Lumbar Exercises: Aerobic   Elliptical Nustep L 5 6 min LE only     Lumbar Exercises: Machines for Strengthening   Cybex Knee Extension 10# 2 x 15 reps   Cybex Knee Flexion 20# 2 x 15 reps    Leg Press 30lb 2x10     Lumbar Exercises: Standing   Other Standing Lumbar Exercises 5# dead lifts  2X10     Lumbar Exercises: Supine   Bridge 15 reps;3 seconds   Straight Leg Raise 10 reps   Other Supine Lumbar Exercises Bridge and march 2x10    Other Supine Lumbar Exercises blue tband clam 2x15     Modalities   Modalities Electrical Stimulation;Iontophoresis;Cryotherapy     Cryotherapy   Number Minutes Cryotherapy 10 Minutes   Type of Cryotherapy Ice pack                  PT Short Term Goals - 02/12/17 1010      PT SHORT TERM GOAL #1   Title  independent with initial HEP   Status Achieved           PT Long Term Goals - 03/18/17 1637      PT LONG TERM GOAL #1   Title walk all distances without assistive device   Status Partially Met     PT LONG TERM GOAL #2   Title increase strength of the right LE to 4+/5   Status Partially Met     PT LONG TERM GOAL #3   Title decrease pain 50%   Status Achieved     PT LONG TERM GOAL #4   Title increase SLR of the right LE to 70 degrees   Status Achieved               Plan - 03/18/17 1637    Clinical Impression Statement no reports of increase pain with today's exercises, does fatigue quick with isometric bridge and march, cues needed not to hold breath. Reports that she continues to ambulate without  SPC   Rehab Potential Good   PT Frequency 2x / week   PT Duration 8 weeks   PT Treatment/Interventions ADLs/Self Care Home Management;Cryotherapy;Electrical Stimulation;Iontophoresis 56m/ml Dexamethasone;Functional mobility training;Gait training;Stair training;Ultrasound;Traction;Moist Heat;Therapeutic exercise;Therapeutic activities;Balance training;Neuromuscular re-education;Patient/family education;Manual techniques   PT Next Visit Plan increase strength and progres gait      Patient will benefit from skilled therapeutic intervention in order to improve the following deficits and impairments:  Abnormal gait, Decreased activity tolerance, Decreased balance, Decreased mobility, Decreased strength, Impaired flexibility, Improper body mechanics, Pain, Difficulty walking, Decreased range of motion  Visit Diagnosis: Acute right-sided low back pain with right-sided sciatica  Difficulty in walking, not elsewhere classified     Problem List There are no active problems to display for this patient.   RScot Jun PTA 03/18/2017, 4:40 PM  [address] [address]Phone:  [phone]  Fax:  [phone] Name: MSTEPHANNIE BRONERMRN: [id number] of Birth: [birth year]

## 2017-03-19 ENCOUNTER — Ambulatory Visit: Payer: Federal, State, Local not specified - PPO | Admitting: Rehabilitation

## 2017-03-19 ENCOUNTER — Encounter: Payer: Self-pay | Admitting: Rehabilitation

## 2017-03-19 DIAGNOSIS — R262 Difficulty in walking, not elsewhere classified: Secondary | ICD-10-CM | POA: Diagnosis not present

## 2017-03-19 DIAGNOSIS — M5441 Lumbago with sciatica, right side: Secondary | ICD-10-CM

## 2017-03-19 NOTE — Therapy (Signed)
[place] [address] [place], [place], [address] Phone: [phone]   Fax:  [phone]  Physical Therapy Treatment  Patient Details  Name: Suzanne Hines MRN: [id number] Date of Birth: [birth year] Referring Provider: Tamala Julian  Encounter Date: 03/19/2017      PT End of Session - 03/19/17 1724    Visit Number 12   Date for PT Re-Evaluation 04/05/17   PT Start Time 1615   PT Stop Time 1708   PT Time Calculation (min) 53 min   Activity Tolerance Patient tolerated treatment well      Past Medical History:  Diagnosis Date  . Hypertension     Past Surgical History:  Procedure Laterality Date  . ABLATION      There were no vitals filed for this visit.      Subjective Assessment - 03/19/17 1618    Subjective Going to MD on Monday.  Pt reports that things are going well but that therapy can continue to help.  The biggest issue continues to be sciatic nerve pain in the back of the R LE.  Still worsens with activity. Not having to use the cane anymore   Currently in Pain? Yes   Pain Score 3    Pain Location Buttocks   Pain Orientation Right   Pain Descriptors / Indicators Aching   Aggravating Factors  increased activity, prolonged sitting   Pain Relieving Factors rest, change of positions, medication                         [facility] Adult PT Treatment/Exercise - 03/19/17 0001      Lumbar Exercises: Aerobic   Elliptical Nustep L 5 6 min LE only     Lumbar Exercises: Standing   Wall Slides 10 reps   Wall Slides Limitations vcs for PPT   Other Standing Lumbar Exercises stationary "running man" motion with blue band x 10 B   Other Standing Lumbar Exercises SL blue band rotational resistance 4xbil     Knee/Hip Exercises: Stretches   Active Hamstring Stretch Limitations nerve flossing x 10 by pt     Knee/Hip Exercises: Machines for Strengthening   Cybex Knee Extension 20# 2x10   Cybex  Knee Flexion 20# 2x10   Cybex Leg Press 30# 2x10   Other Machine row 20# 2x10     Cryotherapy   Number Minutes Cryotherapy 15 Minutes   Type of Cryotherapy Ice pack     Manual Therapy   Manual therapy comments R sciatic n flossing at R1/P1 3x10 with ankle DF, Mulligan SLR x 3                PT Education - 03/19/17 1708    Education provided Yes   Education Details self sciatic flossing   Person(s) Educated Patient   Methods Explanation;Handout;Demonstration;Verbal cues          PT Short Term Goals - 02/12/17 1010      PT SHORT TERM GOAL #1   Title independent with initial HEP   Status Achieved           PT Long Term Goals - 03/18/17 1637      PT LONG TERM GOAL #1   Title walk all distances without assistive device   Status Partially Met     PT LONG TERM GOAL #2   Title increase strength of the right LE to 4+/5   Status Partially  Met     PT LONG TERM GOAL #3   Title decrease pain 50%   Status Achieved     PT LONG TERM GOAL #4   Title increase SLR of the right LE to 70 degrees   Status Achieved               Plan - 03/19/17 1724    Clinical Impression Statement Pt is making excellent progress with PT overall.,  Has been able to get rid of the [facility].  Pt continues to have R LE sciatic nerve related pain increased with activity and decreased with rest and medication.     Rehab Potential Good   PT Frequency 2x / week   PT Duration 8 weeks   PT Treatment/Interventions ADLs/Self Care Home Management;Cryotherapy;Electrical Stimulation;Iontophoresis 55m/ml Dexamethasone;Functional mobility training;Gait training;Stair training;Ultrasound;Traction;Moist Heat;Therapeutic exercise;Therapeutic activities;Balance training;Neuromuscular re-education;Patient/family education;Manual techniques   PT Next Visit Plan continue strength, gait, balance, nerve glides      Patient will benefit from skilled therapeutic intervention in order to improve the following  deficits and impairments:  Abnormal gait, Decreased activity tolerance, Decreased balance, Decreased mobility, Decreased strength, Impaired flexibility, Improper body mechanics, Pain, Difficulty walking, Decreased range of motion  Visit Diagnosis: Acute right-sided low back pain with right-sided sciatica  Difficulty in walking, not elsewhere classified     Problem List There are no active problems to display for this patient.   TStark Bray DPT, CMP 03/19/2017, 5:27 PM  [address] [address]Phone: [phone]  Fax:  [phone] Name: MLOWELLA KINDLEYMRN: [id number] of Birth: [birth year]

## 2017-03-24 ENCOUNTER — Encounter: Payer: Self-pay | Admitting: Physical Therapy

## 2017-03-24 ENCOUNTER — Ambulatory Visit: Payer: Federal, State, Local not specified - PPO | Admitting: Physical Therapy

## 2017-03-24 DIAGNOSIS — M5441 Lumbago with sciatica, right side: Secondary | ICD-10-CM | POA: Diagnosis not present

## 2017-03-24 DIAGNOSIS — R262 Difficulty in walking, not elsewhere classified: Secondary | ICD-10-CM | POA: Diagnosis not present

## 2017-03-24 NOTE — Therapy (Signed)
[place] [address] [place], [place], [address] Phone: [phone]   Fax:  [phone]  Physical Therapy Treatment  Patient Details  Name: ETTER ROYALL MRN: [id number] Date of Birth: 03-21-1966 Referring Provider: Tamala Julian  Encounter Date: 03/24/2017      PT End of Session - 03/24/17 1638    Visit Number 13   Date for PT Re-Evaluation 04/05/17   PT Start Time 1555   PT Stop Time 1646   PT Time Calculation (min) 51 min   Activity Tolerance Patient tolerated treatment well   Behavior During Therapy [facility] for tasks assessed/performed      Past Medical History:  Diagnosis Date  . Hypertension     Past Surgical History:  Procedure Laterality Date  . ABLATION      There were no vitals filed for this visit.      Subjective Assessment - 03/24/17 1556    Subjective "Im spent at this time of the day" Pt reports that she was unable to see the MD because she got her appt.. time wrong   Currently in Pain? Yes   Pain Score 4    Pain Location Leg   Pain Orientation Right;Posterior                         [facility] Adult PT Treatment/Exercise - 03/24/17 0001      Lumbar Exercises: Aerobic   Elliptical Nustep L 5 6 min LE only     Lumbar Exercises: Standing   Row Power tower;Both;10 reps  x2   Row Limitations 25lb   Other Standing Lumbar Exercises 5lb dead lifts 2x10    Other Standing Lumbar Exercises sit to stand wth forward reach yellow ball 2x10      Lumbar Exercises: Supine   Other Supine Lumbar Exercises Bridge and march 2x10      Knee/Hip Exercises: Stretches   Passive Hamstring Stretch 4 reps;10 seconds   Passive Hamstring Stretch Limitations 2 reps with traction    Piriformis Stretch Both;3 reps;10 seconds     Knee/Hip Exercises: Machines for Strengthening   Cybex Knee Extension 20# 3x10   Cybex Knee Flexion 25# 3x10   Cybex Leg Press 30# 2x15   Other Machine row &   Lats 25# 2x10     Cryotherapy   Number Minutes Cryotherapy 10 Minutes   Type of Cryotherapy Ice pack                  PT Short Term Goals - 02/12/17 1010      PT SHORT TERM GOAL #1   Title independent with initial HEP   Status Achieved           PT Long Term Goals - 03/24/17 1642      PT LONG TERM GOAL #1   Title walk all distances without assistive device   Status New     PT LONG TERM GOAL #2   Title increase strength of the right LE to 4+/5   Status Partially Met     PT LONG TERM GOAL #3   Title decrease pain 50%   Status Achieved     PT LONG TERM GOAL #4   Title increase SLR of the right LE to 70 degrees   Status Achieved               Plan - 03/24/17 1639    Clinical Impression Statement  Pt enters clinic reporting increase fatigue, she reports hat she only has issues with sciatic related nerve pain that goes down her RLE. Pt reports that she is unable to stand on her feet for long periods at a time nor is she able to do her walking routine. Completed all io today's interventions well. She was able to increase her supine HS flexibility after passive HS stretching with traction.   Rehab Potential Good   PT Frequency 2x / week   PT Duration 8 weeks   PT Treatment/Interventions ADLs/Self Care Home Management;Cryotherapy;Electrical Stimulation;Iontophoresis 14m/ml Dexamethasone;Functional mobility training;Gait training;Stair training;Ultrasound;Traction;Moist Heat;Therapeutic exercise;Therapeutic activities;Balance training;Neuromuscular re-education;Patient/family education;Manual techniques   PT Next Visit Plan continue strength, gait, balance, nerve glides      Patient will benefit from skilled therapeutic intervention in order to improve the following deficits and impairments:  Abnormal gait, Decreased activity tolerance, Decreased balance, Decreased mobility, Decreased strength, Impaired flexibility, Improper body mechanics, Pain, Difficulty  walking, Decreased range of motion  Visit Diagnosis: Acute right-sided low back pain with right-sided sciatica  Difficulty in walking, not elsewhere classified     Problem List There are no active problems to display for this patient.   RScot Jun5/22/2018, 4:43 PM  [address] [address]Phone: [phone]  Fax:  [phone] Name: MKATHALENE SPORERMRN: [id number] of Birth: [birth year]

## 2017-03-25 DIAGNOSIS — M62830 Muscle spasm of back: Secondary | ICD-10-CM | POA: Diagnosis not present

## 2017-03-25 DIAGNOSIS — M5416 Radiculopathy, lumbar region: Secondary | ICD-10-CM | POA: Diagnosis not present

## 2017-03-25 DIAGNOSIS — S76311S Strain of muscle, fascia and tendon of the posterior muscle group at thigh level, right thigh, sequela: Secondary | ICD-10-CM | POA: Diagnosis not present

## 2017-03-25 DIAGNOSIS — M5432 Sciatica, left side: Secondary | ICD-10-CM | POA: Diagnosis not present

## 2017-03-26 ENCOUNTER — Ambulatory Visit: Payer: Federal, State, Local not specified - PPO | Admitting: Physical Therapy

## 2017-03-26 ENCOUNTER — Encounter: Payer: Self-pay | Admitting: Physical Therapy

## 2017-03-26 DIAGNOSIS — R262 Difficulty in walking, not elsewhere classified: Secondary | ICD-10-CM | POA: Diagnosis not present

## 2017-03-26 DIAGNOSIS — M5441 Lumbago with sciatica, right side: Secondary | ICD-10-CM

## 2017-03-26 NOTE — Therapy (Signed)
[place] [address] [place], [place], [address] Phone: [phone]   Fax:  [phone]  Physical Therapy Treatment  Patient Details  Name: Suzanne Hines MRN: [id number] Date of Birth: 06/29/1966 Referring Provider: Tamala Julian  Encounter Date: 03/26/2017      PT End of Session - 03/26/17 1632    Visit Number 14   Date for PT Re-Evaluation 04/05/17   PT Start Time 8280   PT Stop Time 1646   PT Time Calculation (min) 51 min      Past Medical History:  Diagnosis Date  . Hypertension     Past Surgical History:  Procedure Laterality Date  . ABLATION      There were no vitals filed for this visit.      Subjective Assessment - 03/26/17 1558    Subjective sore after last session, saw MD yesterday and he said it is a slow process to heal- feels it is HS tear not back   Currently in Pain? Yes   Pain Score 4    Pain Location Leg   Pain Orientation Right;Posterior                         [facility] Adult PT Treatment/Exercise - 03/26/17 0001      Lumbar Exercises: Standing   Other Standing Lumbar Exercises 5lb dead lifts 2x10      Lumbar Exercises: Quadruped   Opposite Arm/Leg Raise Limitations glut/HS ex 4 way 10 reps each 3#     Knee/Hip Exercises: Aerobic   Nustep Nustep L 5 6 min     Knee/Hip Exercises: Machines for Strengthening   Cybex Knee Extension 20# 3x10   Cybex Knee Flexion 25# 3x10   Cybex Leg Press 30# 3 sets 10  toes points 3 way to change angle on muscle   Other Machine cable press down 3 set s 10  40#     Knee/Hip Exercises: Seated   Sit to Sand 2 sets;10 reps;without UE support  wt ball     Knee/Hip Exercises: Supine   Bridges with [employer] Both;Strengthening;15 reps  legs on ball 15 3 sec hold   Straight Leg Raises Strengthening;Right;15 reps  3# with abd     Knee/Hip Exercises: Sidelying   Other Sidelying Knee/Hip Exercises 3# circles 10 each     Cryotherapy   Number Minutes Cryotherapy 10 Minutes   Type of Cryotherapy Ice pack                  PT Short Term Goals - 02/12/17 1010      PT SHORT TERM GOAL #1   Title independent with initial HEP   Status Achieved           PT Long Term Goals - 03/26/17 1635      PT LONG TERM GOAL #1   Title walk all distances without assistive device   Status Partially Met     PT LONG TERM GOAL #2   Title increase strength of the right LE to 4+/5   Status On-going     PT LONG TERM GOAL #3   Title decrease pain 50%   Status On-going     PT LONG TERM GOAL #4   Title increase SLR of the right LE to 70 degrees   Status Achieved               Plan - 03/26/17 1635  Clinical Impression Statement pt verb seeing MD this week and he is confident it is a HS tar and will take time. Pt verb soreness but feels PT is helping. Amb without AD but antalgic esp as fatgues. progressing with goals.   PT Next Visit Plan HS strength and ROM, gait      Patient will benefit from skilled therapeutic intervention in order to improve the following deficits and impairments:  Abnormal gait, Decreased activity tolerance, Decreased balance, Decreased mobility, Decreased strength, Impaired flexibility, Improper body mechanics, Pain, Difficulty walking, Decreased range of motion  Visit Diagnosis: Acute right-sided low back pain with right-sided sciatica  Difficulty in walking, not elsewhere classified     Problem List There are no active problems to display for this patient.   Chelsey Kimberley,ANGIE PTA 03/26/2017, 4:37 PM  [place] [address] [place], [place], [address] Phone: [phone]   Fax:  [phone]  Name: Suzanne Hines MRN: [id number] Date of Birth: 1966/06/23

## 2017-03-31 ENCOUNTER — Encounter: Payer: Self-pay | Admitting: Physical Therapy

## 2017-03-31 ENCOUNTER — Ambulatory Visit: Payer: Federal, State, Local not specified - PPO | Admitting: Physical Therapy

## 2017-03-31 DIAGNOSIS — M5441 Lumbago with sciatica, right side: Secondary | ICD-10-CM | POA: Diagnosis not present

## 2017-03-31 DIAGNOSIS — R262 Difficulty in walking, not elsewhere classified: Secondary | ICD-10-CM | POA: Diagnosis not present

## 2017-03-31 NOTE — Therapy (Signed)
[place] [address] [place], [place], [address] Phone: [phone]   Fax:  [phone]  Physical Therapy Treatment  Patient Details  Name: Suzanne Hines MRN: [id number] Date of Birth: 09-09-1966 Referring Provider: Tamala Julian  Encounter Date: 03/31/2017      PT End of Session - 03/31/17 1644    Visit Number 15   Date for PT Re-Evaluation 04/05/17   PT Start Time 1555   PT Stop Time 1652   PT Time Calculation (min) 57 min   Activity Tolerance Patient tolerated treatment well   Behavior During Therapy [facility] for tasks assessed/performed      Past Medical History:  Diagnosis Date  . Hypertension     Past Surgical History:  Procedure Laterality Date  . ABLATION      There were no vitals filed for this visit.      Subjective Assessment - 03/31/17 1554    Subjective "Not as sore after last session"   Currently in Pain? Yes   Pain Score 2    Pain Location Leg   Pain Orientation Right;Posterior                         [facility] Adult PT Treatment/Exercise - 03/31/17 0001      Lumbar Exercises: Aerobic   Elliptical Nustep L 5 6 min LE only     Lumbar Exercises: Quadruped   Opposite Arm/Leg Raise Limitations Hip ect 2x10 each      Knee/Hip Exercises: Machines for Strengthening   Cybex Knee Extension 20# 3x10   Cybex Knee Flexion 25# 3x10   Cybex Leg Press 30# 3 sets 10   Other Machine cable press down 3 set s 10  40#     Knee/Hip Exercises: Seated   Sit to Sand 2 sets;10 reps;without UE support  yellow ball      Knee/Hip Exercises: Supine   Bridges with [employer] Both;Strengthening;15 reps   Straight Leg Raises Strengthening;Both;10 reps;1 set  with abd    Other Supine Knee/Hip Exercises LE on ball bridges x15      Modalities   Modalities Cryotherapy     Cryotherapy   Number Minutes Cryotherapy 10 Minutes   Type of Cryotherapy Ice pack                   PT Short Term Goals - 02/12/17 1010      PT SHORT TERM GOAL #1   Title independent with initial HEP   Status Achieved           PT Long Term Goals - 03/31/17 1646      PT LONG TERM GOAL #1   Title walk all distances without assistive device   Status Partially Met     PT LONG TERM GOAL #2   Title increase strength of the right LE to 4+/5   Status On-going     PT LONG TERM GOAL #4   Title increase SLR of the right LE to 70 degrees   Status Achieved               Plan - 03/31/17 1645    Clinical Impression Statement Progressing towards all goals, reports a burning sensation with hook lying ans quadruped interventions. No reports of increase pain.   Rehab Potential Good   PT Frequency 2x / week   PT Duration 2 weeks   PT Treatment/Interventions ADLs/Self Care Home  Management;Cryotherapy;Electrical Stimulation;Iontophoresis 69m/ml Dexamethasone;Functional mobility training;Gait training;Stair training;Ultrasound;Traction;Moist Heat;Therapeutic exercise;Therapeutic activities;Balance training;Neuromuscular re-education;Patient/family education;Manual techniques   PT Next Visit Plan HS strength and ROM, gait      Patient will benefit from skilled therapeutic intervention in order to improve the following deficits and impairments:  Abnormal gait, Decreased activity tolerance, Decreased balance, Decreased mobility, Decreased strength, Impaired flexibility, Improper body mechanics, Pain, Difficulty walking, Decreased range of motion  Visit Diagnosis: Acute right-sided low back pain with right-sided sciatica  Difficulty in walking, not elsewhere classified     Problem List There are no active problems to display for this patient.   RScot Jun PTA 03/31/2017, 4:47 PM  [address] [address]Phone: [phone]  Fax:  [phone] Name: Suzanne COVEMRN:  [id number] of Birth: 1January 06, 1967

## 2017-04-02 ENCOUNTER — Ambulatory Visit: Payer: Federal, State, Local not specified - PPO | Admitting: Physical Therapy

## 2017-04-02 ENCOUNTER — Encounter: Payer: Self-pay | Admitting: Physical Therapy

## 2017-04-02 DIAGNOSIS — R262 Difficulty in walking, not elsewhere classified: Secondary | ICD-10-CM

## 2017-04-02 DIAGNOSIS — M5441 Lumbago with sciatica, right side: Secondary | ICD-10-CM | POA: Diagnosis not present

## 2017-04-02 NOTE — Therapy (Signed)
[place], [place], [address] Phone: [phone]   Fax:  [phone]  Physical Therapy Treatment  Patient Details  Name: Suzanne Hines MRN: [id number] Date of Birth: 07-07-1966 Referring Provider: Tamala Julian  Encounter Date: 04/02/2017      PT End of Session - 04/02/17 1700    Visit Number 16   Date for PT Re-Evaluation 04/05/17   PT Start Time 1600   PT Stop Time 1700   PT Time Calculation (min) 60 min      Past Medical History:  Diagnosis Date  . Hypertension     Past Surgical History:  Procedure Laterality Date  . ABLATION      There were no vitals filed for this visit.      Subjective Assessment - 04/02/17 1611    Subjective morning are maybe a 1/10 pain, increase in afternoon. 75% better overall   Currently in Pain? Yes   Pain Score 3    Pain Location Leg                         [facility] Adult PT Treatment/Exercise - 04/02/17 0001      Lumbar Exercises: Aerobic   Elliptical L 6 6 min LE only   Tread Mill backward 2 min 73mh     Lumbar Exercises: Prone   Straight Leg Raise 20 reps  2 sets   Other Prone Lumbar Exercises plank 20 sec 2 times     Lumbar Exercises: Quadruped   Opposite Arm/Leg Raise Right arm/Left leg;Left arm/Right leg;20 reps   Opposite Arm/Leg Raise Limitations 2 sets      Knee/Hip Exercises: Aerobic   Elliptical 2 fwd/2 back I 8 R 5     Knee/Hip Exercises: Machines for Strengthening   Cybex Knee Extension 20# 3x10   Cybex Knee Flexion 25# 3x10   Cybex Leg Press 40# 3 sets 10  calf raises 40# 2 sets 15     Cryotherapy   Number Minutes Cryotherapy 10 Minutes   Type of Cryotherapy Ice pack                  PT Short Term Goals - 02/12/17 1010      PT SHORT TERM GOAL #1   Title independent with initial HEP   Status Achieved           PT Long Term Goals - 04/02/17 1615      PT LONG TERM GOAL #1   Title  walk all distances without assistive device   Status Achieved     PT LONG TERM GOAL #2   Title increase strength of the right LE to 4+/5   Baseline HS 4/5   Status Partially Met     PT LONG TERM GOAL #3   Title decrease pain 50%   Status Achieved     PT LONG TERM GOAL #4   Title increase SLR of the right LE to 70 degrees   Baseline with pain   Status Achieved               Plan - 04/02/17 1700    Clinical Impression Statement improved func and gait with minimal c/o pain. toelrsted increased ther ex today   PT Next Visit Plan HS strength and ROM, gait      Patient will benefit from skilled therapeutic intervention in order to improve the following deficits and impairments:  Visit Diagnosis: Acute right-sided low back pain with right-sided sciatica  Difficulty in walking, not elsewhere classified     Problem List There are no active problems to display for this patient.   Tomaz Janis,ANGIE PTA 04/02/2017, 5:01 PM  [place] [address] [place], [place], [address] Phone: [phone]   Fax:  [phone]  Name: GERALDYNE BARRACLOUGH MRN: [id number] Date of Birth: [birth year]

## 2017-04-07 ENCOUNTER — Encounter: Payer: Self-pay | Admitting: Physical Therapy

## 2017-04-07 ENCOUNTER — Ambulatory Visit: Payer: Federal, State, Local not specified - PPO | Attending: Family Medicine | Admitting: Physical Therapy

## 2017-04-07 DIAGNOSIS — M5441 Lumbago with sciatica, right side: Secondary | ICD-10-CM

## 2017-04-07 DIAGNOSIS — R262 Difficulty in walking, not elsewhere classified: Secondary | ICD-10-CM | POA: Diagnosis not present

## 2017-04-07 NOTE — Therapy (Signed)
[place] [address] [place], [place], [address] Phone: [phone]   Fax:  [phone]  Physical Therapy Treatment  Patient Details  Name: Suzanne Hines MRN: [id number] Date of Birth: 08/25/1966 Referring Provider: Tamala Julian  Encounter Date: 04/07/2017      PT End of Session - 04/07/17 1742    Visit Number 17   Date for PT Re-Evaluation 05/05/17   PT Start Time 1651   PT Stop Time 1735   PT Time Calculation (min) 44 min   Activity Tolerance Patient tolerated treatment well   Behavior During Therapy [facility] for tasks assessed/performed      Past Medical History:  Diagnosis Date  . Hypertension     Past Surgical History:  Procedure Laterality Date  . ABLATION      There were no vitals filed for this visit.      Subjective Assessment - 04/07/17 1702    Subjective Reports hamstring pain as the day goes on, currently having some pain, still no cane but a limp   Currently in Pain? Yes   Pain Score 3    Pain Location Leg   Pain Orientation Right;Posterior;Upper                         [facility] Adult PT Treatment/Exercise - 04/07/17 0001      High Level Balance   High Level Balance Comments resisted gait all directions     Lumbar Exercises: Aerobic   Elliptical R=6 I =10 x 5 minutes   UBE (Upper Arm Bike) Nustep Level 6 x 6 minutes     Lumbar Exercises: Seated   Sit to Stand 20 reps   Sit to Stand Limitations hold 5#     Knee/Hip Exercises: Machines for Strengthening   Cybex Knee Extension 20# 3x10   Cybex Knee Flexion 25# 3x10   Cybex Leg Press 40# 3 sets 10   Other Machine 5# hip extension and abduction                  PT Short Term Goals - 02/12/17 1010      PT SHORT TERM GOAL #1   Title independent with initial HEP   Status Achieved           PT Long Term Goals - 04/07/17 1745      PT LONG TERM GOAL #1   Title walk all distances without assistive  device   Status Achieved     PT LONG TERM GOAL #2   Title increase strength of the right LE to 4+/5   Status Partially Met     PT LONG TERM GOAL #3   Title decrease pain 50%   Status Achieved     PT LONG TERM GOAL #4   Title increase SLR of the right LE to 70 degrees   Status Achieved               Plan - 04/07/17 1743    Clinical Impression Statement Patient overall doing much better, now walking without assistive device.  She has weakness in the hips, had a lot of difficulty with hip abduction with 5#.  At the end of the day she has a limp, but earlier in the day she has minimal deviation to her gait.   PT Frequency 2x / week   PT Duration 4 weeks   PT Treatment/Interventions ADLs/Self Care Home Management;Cryotherapy;Electrical Stimulation;Iontophoresis 41m/ml  Dexamethasone;Functional mobility training;Gait training;Stair training;Ultrasound;Traction;Moist Heat;Therapeutic exercise;Therapeutic activities;Balance training;Neuromuscular re-education;Patient/family education;Manual techniques   PT Next Visit Plan continue to work on her strength and function   Consulted and Agree with Plan of Care Patient      Patient will benefit from skilled therapeutic intervention in order to improve the following deficits and impairments:  Abnormal gait, Decreased activity tolerance, Decreased balance, Decreased mobility, Decreased strength, Impaired flexibility, Improper body mechanics, Pain, Difficulty walking, Decreased range of motion  Visit Diagnosis: Acute right-sided low back pain with right-sided sciatica - Plan: PT plan of care cert/re-cert  Difficulty in walking, not elsewhere classified - Plan: PT plan of care cert/re-cert     Problem List There are no active problems to display for this patient.   Sumner Boast., PT 04/07/2017, 5:47 PM  [place] [address] [place], [place], [address] Phone:  [phone]   Fax:  [phone]  Name: Suzanne Hines MRN: [id number] Date of Birth: 06/11/1966

## 2017-04-09 ENCOUNTER — Ambulatory Visit: Payer: Federal, State, Local not specified - PPO | Admitting: Physical Therapy

## 2017-04-09 ENCOUNTER — Encounter: Payer: Self-pay | Admitting: Physical Therapy

## 2017-04-09 DIAGNOSIS — M5441 Lumbago with sciatica, right side: Secondary | ICD-10-CM | POA: Diagnosis not present

## 2017-04-09 DIAGNOSIS — R262 Difficulty in walking, not elsewhere classified: Secondary | ICD-10-CM | POA: Diagnosis not present

## 2017-04-09 NOTE — Therapy (Signed)
[place] [facility] [place], [place], [address] Phone: [phone]   Fax:  [phone]  Physical Therapy Treatment  Patient Details  Name: Suzanne Hines MRN: [id number] Date of Birth: 05-10-1966 Referring Provider: Tamala Julian  Encounter Date: 04/09/2017      PT End of Session - 04/09/17 1646    Visit Number 18   Date for PT Re-Evaluation 05/05/17   PT Start Time 1600   PT Stop Time 1644   PT Time Calculation (min) 44 min   Activity Tolerance Patient tolerated treatment well   Behavior During Therapy Flat affect      Past Medical History:  Diagnosis Date  . Hypertension     Past Surgical History:  Procedure Laterality Date  . ABLATION      There were no vitals filed for this visit.      Subjective Assessment - 04/09/17 1602    Subjective "About the same as I always feel at this time of the day"   Pain Score 3    Pain Location Leg   Pain Orientation Right;Posterior;Upper                         [facility] Adult PT Treatment/Exercise - 04/09/17 0001      Lumbar Exercises: Aerobic   Elliptical R=6 I =10 x 5 minutes   UBE (Upper Arm Bike) Nustep Level 5 x 6 minutes     Lumbar Exercises: Seated   Sit to Stand 20 reps   Sit to Stand Limitations hold 5#     Lumbar Exercises: Supine   Bridge 20 reps;2 seconds  x2 with weighted ball      Lumbar Exercises: Quadruped   Opposite Arm/Leg Raise Limitations hip ext 2x10; fire hydrants 2x10     Knee/Hip Exercises: Machines for Strengthening   Cybex Leg Press 40# 3 sets 10   Other Machine 5# hip extension and abduction     Knee/Hip Exercises: Standing   Other Standing Knee Exercises SL dead lifts 2lb x19     Knee/Hip Exercises: Seated   Abduction/Adduction  10 reps;2 sets   Abd/Adduction Limitations manual resistance                   PT Short Term Goals - 02/12/17 1010      PT SHORT TERM GOAL #1   Title  independent with initial HEP   Status Achieved           PT Long Term Goals - 04/07/17 1745      PT LONG TERM GOAL #1   Title walk all distances without assistive device   Status Achieved     PT LONG TERM GOAL #2   Title increase strength of the right LE to 4+/5   Status Partially Met     PT LONG TERM GOAL #3   Title decrease pain 50%   Status Achieved     PT LONG TERM GOAL #4   Title increase SLR of the right LE to 70 degrees   Status Achieved               Plan - 04/09/17 1646    Clinical Impression Statement Pt reports that she did not feel well due to a sinus headache. Increase time given to recover between each intervention. Pt with some instability with SL dead lifts, postural cure required. Pt hips fatigue quickly with quadruped interventions.  Rehab Potential Good   PT Frequency 2x / week   PT Duration 4 weeks   PT Treatment/Interventions ADLs/Self Care Home Management;Cryotherapy;Electrical Stimulation;Iontophoresis 21m/ml Dexamethasone;Functional mobility training;Gait training;Stair training;Ultrasound;Traction;Moist Heat;Therapeutic exercise;Therapeutic activities;Balance training;Neuromuscular re-education;Patient/family education;Manual techniques   PT Next Visit Plan continue to work on her strength and function      Patient will benefit from skilled therapeutic intervention in order to improve the following deficits and impairments:  Abnormal gait, Decreased activity tolerance, Decreased balance, Decreased mobility, Decreased strength, Impaired flexibility, Improper body mechanics, Pain, Difficulty walking, Decreased range of motion  Visit Diagnosis: Acute right-sided low back pain with right-sided sciatica  Difficulty in walking, not elsewhere classified     Problem List There are no active problems to display for this patient.    RScot Jun PTA 04/09/2017, 4:48 PM  [address] [address]Phone: [phone]  Fax:  [phone] Name: Suzanne SCHWENKEMRN: [id number] of Birth: 108-30-1967

## 2017-04-14 ENCOUNTER — Ambulatory Visit: Payer: Federal, State, Local not specified - PPO | Admitting: Physical Therapy

## 2017-04-16 ENCOUNTER — Ambulatory Visit: Payer: Federal, State, Local not specified - PPO | Admitting: Physical Therapy

## 2017-04-21 ENCOUNTER — Ambulatory Visit: Payer: Federal, State, Local not specified - PPO | Admitting: Physical Therapy

## 2017-04-21 ENCOUNTER — Encounter: Payer: Self-pay | Admitting: Physical Therapy

## 2017-04-21 DIAGNOSIS — R262 Difficulty in walking, not elsewhere classified: Secondary | ICD-10-CM

## 2017-04-21 DIAGNOSIS — M5441 Lumbago with sciatica, right side: Secondary | ICD-10-CM

## 2017-04-21 NOTE — Therapy (Signed)
[address] [place], [place], [address] Phone: [phone]   Fax:  [phone]  Physical Therapy Evaluation  Patient Details  Name: Suzanne Hines MRN: [id number] Date of Birth: 09-25-1966 Referring Provider: Tamala Julian  Encounter Date: 04/21/2017      PT End of Session - 04/21/17 1357    Visit Number 19   Date for PT Re-Evaluation 05/05/17   PT Start Time 1315   PT Stop Time 1355   PT Time Calculation (min) 40 min   Activity Tolerance Patient tolerated treatment well   Behavior During Therapy [facility] for tasks assessed/performed      Past Medical History:  Diagnosis Date  . Hypertension     Past Surgical History:  Procedure Laterality Date  . ABLATION      There were no vitals filed for this visit.       Subjective Assessment - 04/21/17 1319    Subjective Patient reports that as the day goes she gets some pain and tightness in the right HS origin.   Currently in Pain? Yes   Pain Score 3    Pain Location Leg   Pain Orientation Posterior;Upper   Aggravating Factors  as the day goes on I get more pain and soreness                Objective measurements completed on examination: See above findings.          [facility] Adult PT Treatment/Exercise - 04/21/17 0001      High Level Balance   High Level Balance Comments resisted gait all directions     Lumbar Exercises: Aerobic   Elliptical R=6 I =10 x 5 minutes     Knee/Hip Exercises: Stretches   Passive Hamstring Stretch 4 reps;20 seconds   Piriformis Stretch 4 reps;20 seconds     Knee/Hip Exercises: Machines for Strengthening   Cybex Knee Extension 20# 3x10   Cybex Knee Flexion 25# 3x10   Cybex Leg Press 40# 2sets 10, 20# right only 2x10   Other Machine 5# hip extension and abduction     Knee/Hip Exercises: Standing   Other Standing Knee Exercises SL dead lifts 2lb x10     Manual Therapy   Manual Therapy Soft tissue  mobilization   Soft tissue mobilization right HS origin                  PT Short Term Goals - 02/12/17 1010      PT SHORT TERM GOAL #1   Title independent with initial HEP   Status Achieved           PT Long Term Goals - 04/07/17 1745      PT LONG TERM GOAL #1   Title walk all distances without assistive device   Status Achieved     PT LONG TERM GOAL #2   Title increase strength of the right LE to 4+/5   Status Partially Met     PT LONG TERM GOAL #3   Title decrease pain 50%   Status Achieved     PT LONG TERM GOAL #4   Title increase SLR of the right LE to 70 degrees   Status Achieved                Plan - 04/21/17 1358    Clinical Impression Statement Patient had some increased soreness in the right HS origin.  She reports that is sore as  the day goes on.  She had tighytness with HS stretch.     PT Next Visit Plan write MD note, FOTO and G-code   Consulted and Agree with Plan of Care Patient      Patient will benefit from skilled therapeutic intervention in order to improve the following deficits and impairments:  Abnormal gait, Decreased activity tolerance, Decreased balance, Decreased mobility, Decreased strength, Impaired flexibility, Improper body mechanics, Pain, Difficulty walking, Decreased range of motion  Visit Diagnosis: Acute right-sided low back pain with right-sided sciatica  Difficulty in walking, not elsewhere classified     Problem List There are no active problems to display for this patient.   Sumner Boast., PT 04/21/2017, 2:00 PM  [address] [place], [place], [address] Phone: [phone]   Fax:  [phone]  Name: MIRREN GEST MRN: [id number] Date of Birth: 1966/04/17

## 2017-04-22 ENCOUNTER — Ambulatory Visit: Payer: Federal, State, Local not specified - PPO | Admitting: Physical Therapy

## 2017-04-22 DIAGNOSIS — M5441 Lumbago with sciatica, right side: Secondary | ICD-10-CM | POA: Diagnosis not present

## 2017-04-22 DIAGNOSIS — R262 Difficulty in walking, not elsewhere classified: Secondary | ICD-10-CM

## 2017-04-22 NOTE — Therapy (Signed)
[facility] [address] [place], [place], [address] Phone: [phone]   Fax:  [phone]  Physical Therapy Treatment  Patient Details  Name: XCARET MORAD MRN: [id number] Date of Birth: 16-Apr-1966 Referring Provider: Juanita Laster  Encounter Date: 04/22/2017      PT End of Session - 04/22/17 0918    Visit Number 20   Date for PT Re-Evaluation 05/05/17   PT Start Time 0849   PT Stop Time 0930   PT Time Calculation (min) 41 min   Activity Tolerance Patient tolerated treatment well   Behavior During Therapy [facility] for tasks assessed/performed      Past Medical History:  Diagnosis Date  . Hypertension     Past Surgical History:  Procedure Laterality Date  . ABLATION      There were no vitals filed for this visit.      Subjective Assessment - 04/22/17 0858    Subjective Patient reports that she felt better after the last treatment   Currently in Pain? Yes   Pain Score 1    Pain Location Leg   Pain Orientation Right;Posterior;Upper            [facility] PT Assessment - 04/22/17 0001      AROM   Right Hip Extension 23   Right Hip Flexion 90   Right Hip ABduction 25     Strength   Overall Strength Comments 4/5 with some pain      Flexibility   Hamstrings no SLR is 70 degrees from 45 degrees at evaluatin     Palpation   Palpation comment some soreness in the right HS just distal to the origin                     [facility] Adult PT Treatment/Exercise - 04/22/17 0001      Ambulation/Gait   Gait Comments Gait up and down stairs working on reciprocal pattern, then outside around the building     Knee/Hip Exercises: Machines for Strengthening   Cybex Knee Extension 20# 3x10   Cybex Knee Flexion 25# 3x10   Cybex Leg Press 40# 2sets 10, 20# right only 2x10   Other Machine 5# hip extension and abduction                  PT Short Term Goals - 02/12/17 1010      PT SHORT TERM  GOAL #1   Title independent with initial HEP   Status Achieved           PT Long Term Goals - 04/22/17 0920      PT LONG TERM GOAL #1   Title walk all distances without assistive device   Baseline however she has not been able to return to her prior walking level for exercies   Status Achieved     PT LONG TERM GOAL #2   Title increase strength of the right LE to 4+/5   Status Partially Met     PT LONG TERM GOAL #3   Title decrease pain 50%   Status Achieved     PT LONG TERM GOAL #4   Title increase SLR of the right LE to 70 degrees   Status Achieved               Plan - 04/22/17 0919    Clinical Impression Statement Patient overall is doing better with pain, ROM and strength, she continues to have  soreness/tightness in the right HS as the day goes on.  She also continues to be limited with walking and stairs due to soreness   PT Frequency 2x / week   PT Duration 4 weeks   PT Next Visit Plan We will continue to progress her function and strength   Consulted and Agree with Plan of Care Patient      Patient will benefit from skilled therapeutic intervention in order to improve the following deficits and impairments:  Abnormal gait, Decreased activity tolerance, Decreased balance, Decreased mobility, Decreased strength, Impaired flexibility, Improper body mechanics, Pain, Difficulty walking, Decreased range of motion  Visit Diagnosis: Acute right-sided low back pain with right-sided sciatica  Difficulty in walking, not elsewhere classified       G-Codes - 05/11/2017 0900    Functional Assessment Tool Used (Outpatient Only) foto52% limitation   Functional Limitation Mobility: Walking and moving around   Mobility: Walking and Moving Around Current Status (S1683) At least 40 percent but less than 60 percent impaired, limited or restricted   Mobility: Walking and Moving Around Goal Status [phone]) At least 20 percent but less than 40 percent impaired, limited or  restricted      Problem List There are no active problems to display for this patient.   Sumner Boast., PT 05/11/2017, 9:21 AM  [address] [place], [place], [address] Phone: [phone]   Fax:  [phone]  Name: NILA WINKER MRN: [id number] Date of Birth: 10-Oct-1966

## 2017-04-29 DIAGNOSIS — S76311S Strain of muscle, fascia and tendon of the posterior muscle group at thigh level, right thigh, sequela: Secondary | ICD-10-CM | POA: Diagnosis not present

## 2017-04-29 DIAGNOSIS — M5416 Radiculopathy, lumbar region: Secondary | ICD-10-CM | POA: Diagnosis not present

## 2017-04-29 DIAGNOSIS — M5432 Sciatica, left side: Secondary | ICD-10-CM | POA: Diagnosis not present

## 2017-04-29 DIAGNOSIS — M62830 Muscle spasm of back: Secondary | ICD-10-CM | POA: Diagnosis not present

## 2017-04-30 ENCOUNTER — Ambulatory Visit: Payer: Federal, State, Local not specified - PPO | Admitting: Physical Therapy

## 2017-04-30 DIAGNOSIS — M5441 Lumbago with sciatica, right side: Secondary | ICD-10-CM | POA: Diagnosis not present

## 2017-04-30 DIAGNOSIS — R262 Difficulty in walking, not elsewhere classified: Secondary | ICD-10-CM

## 2017-04-30 NOTE — Therapy (Signed)
[address] [place], [place], [address] Phone: [phone]   Fax:  [phone]  Physical Therapy Treatment  Patient Details  Name: Suzanne Hines MRN: [id number] Date of Birth: December 19, 1965 Referring Provider: Tamala Julian  Encounter Date: 04/30/2017      PT End of Session - 04/30/17 1649    Visit Number 21   Date for PT Re-Evaluation 06/05/17   PT Start Time 2876   PT Stop Time 1655   PT Time Calculation (min) 46 min   Activity Tolerance Patient tolerated treatment well   Behavior During Therapy [facility] for tasks assessed/performed      Past Medical History:  Diagnosis Date  . Hypertension     Past Surgical History:  Procedure Laterality Date  . ABLATION      There were no vitals filed for this visit.      Subjective Assessment - 04/30/17 1650    Subjective Patient saw the MD yesterday, he felt like she was doing better and having less pain, demonstrating better strength.   Currently in Pain? Yes   Pain Score 1    Pain Location Leg   Pain Orientation Right;Posterior;Upper                         [facility] Adult PT Treatment/Exercise - 04/30/17 0001      Ambulation/Gait   Gait Comments Resisted ambulation forwards and backward.     Lumbar Exercises: Stretches   Passive Hamstring Stretch 4 reps;20 seconds   Piriformis Stretch 4 reps;30 seconds     Lumbar Exercises: Seated   Other Seated Lumbar Exercises Sit to stands with clamshell (green theraband) 10 rep      Lumbar Exercises: Supine   Other Supine Lumbar Exercises Bridge with exercises ball under feet. 10 reps. Same exercise with knees bent 5 reps.    Other Supine Lumbar Exercises Bridge with clamshells (green theraband) 2x10      Knee/Hip Exercises: Aerobic   Elliptical R- 6, I - 10 for 5 min.      Knee/Hip Exercises: Machines for Strengthening   Cybex Knee Extension 20# 3x10   Cybex Knee Flexion 25# 3x10    Cybex Leg Press 40# 2sets 10, 20# right only 2x10   Other Machine 5# hip extension and abduction                  PT Short Term Goals - 02/12/17 1010      PT SHORT TERM GOAL #1   Title independent with initial HEP   Status Achieved           PT Long Term Goals - 04/30/17 1652      PT LONG TERM GOAL #1   Title walk all distances without assistive device   Status Achieved     PT LONG TERM GOAL #2   Title increase strength of the right LE to 4+/5   Status Partially Met     PT LONG TERM GOAL #3   Title decrease pain 50%   Status Achieved     PT LONG TERM GOAL #4   Title increase SLR of the right LE to 70 degrees   Status Achieved               Plan - 04/30/17 1651    Clinical Impression Statement Ms. Virgo is continuing to do better with less pain, increased function and strength, I can  really tell a difference with morning to evening treatments, she is more tired and her legs are shaky in the evening   PT Next Visit Plan We will continue to progress her function and strength   Consulted and Agree with Plan of Care Patient      Patient will benefit from skilled therapeutic intervention in order to improve the following deficits and impairments:  Abnormal gait, Decreased activity tolerance, Decreased balance, Decreased mobility, Decreased strength, Impaired flexibility, Improper body mechanics, Pain, Difficulty walking, Decreased range of motion  Visit Diagnosis: Acute right-sided low back pain with right-sided sciatica - Plan: PT plan of care cert/re-cert  Difficulty in walking, not elsewhere classified - Plan: PT plan of care cert/re-cert     Problem List There are no active problems to display for this patient.   Sumner Boast., PT 04/30/2017, 4:53 PM  [address] [place], [place], [address] Phone: [phone]   Fax:  [phone]  Name: Suzanne Hines MRN: [id number] Date of Birth: Jun 12, 1966

## 2017-05-07 ENCOUNTER — Encounter: Payer: Self-pay | Admitting: Physical Therapy

## 2017-05-07 ENCOUNTER — Ambulatory Visit: Payer: Federal, State, Local not specified - PPO | Attending: Family Medicine | Admitting: Physical Therapy

## 2017-05-07 DIAGNOSIS — R262 Difficulty in walking, not elsewhere classified: Secondary | ICD-10-CM | POA: Diagnosis not present

## 2017-05-07 DIAGNOSIS — M5441 Lumbago with sciatica, right side: Secondary | ICD-10-CM | POA: Insufficient documentation

## 2017-05-07 NOTE — Therapy (Signed)
[place] [address] [place], [place], [address] Phone: [phone]   Fax:  [phone]  Physical Therapy Treatment  Patient Details  Name: Suzanne Hines MRN: [id number] Date of Birth: 1966/09/13 Referring Provider: Tamala Julian  Encounter Date: 05/07/2017      PT End of Session - 05/07/17 0909    Visit Number 22   Date for PT Re-Evaluation 06/05/17   PT Start Time 0845   PT Stop Time 0913   PT Time Calculation (min) 28 min      Past Medical History:  Diagnosis Date  . Hypertension     Past Surgical History:  Procedure Laterality Date  . ABLATION      There were no vitals filed for this visit.      Subjective Assessment - 05/07/17 0846    Subjective swam all day yesterday and it felt good. I need to leave in about 30 min to get to work.   Currently in Pain? Yes   Pain Score 1    Pain Location Leg   Pain Descriptors / Indicators Aching                         [facility] Adult PT Treatment/Exercise - 05/07/17 0001      Lumbar Exercises: Aerobic   Elliptical 3 fwd/3 back     Lumbar Exercises: Prone   Other Prone Lumbar Exercises plank 30 sec 2 times  green tband ext  and HS curl 2 sets 10   Other Prone Lumbar Exercises alt leg lift 10 2 times in plank     Knee/Hip Exercises: Machines for Strengthening   Cybex Knee Extension 20# 3x10   Cybex Knee Flexion 25# 3x10   Cybex Leg Press 30# plyo 2 sets 20     Knee/Hip Exercises: Standing   Forward Step Up 20 reps;Hand Hold: 1;2 sets  BOSU alt LE- decreased confidence and fear of falling   Functional Squat 20 reps  on BOSU - cuing as pt favors RT leg                  PT Short Term Goals - 02/12/17 1010      PT SHORT TERM GOAL #1   Title independent with initial HEP   Status Achieved           PT Long Term Goals - 04/30/17 1652      PT LONG TERM GOAL #1   Title walk all distances without assistive device   Status Achieved     PT LONG TERM GOAL #2   Title increase strength of the right LE to 4+/5   Status Partially Met     PT LONG TERM GOAL #3   Title decrease pain 50%   Status Achieved     PT LONG TERM GOAL #4   Title increase SLR of the right LE to 70 degrees   Status Achieved               Plan - 05/07/17 0909    Clinical Impression Statement pt required cuing with new intervenes today for compensations. Pt still favors RT LE and has fear of falling. pt requested need to leave early d/t having to be to work.   PT Next Visit Plan We will continue to progress her function and strength      Patient will benefit from skilled therapeutic intervention in order to improve the  following deficits and impairments:     Visit Diagnosis: Acute right-sided low back pain with right-sided sciatica  Difficulty in walking, not elsewhere classified     Problem List There are no active problems to display for this patient.   Brenn Deziel,ANGIE  PTA 05/07/2017, 9:11 AM  [place] [address] [place], [place], [address] Phone: [phone]   Fax:  [phone]  Name: Suzanne Hines MRN: [id number] Date of Birth: 14-Oct-1966

## 2017-05-12 ENCOUNTER — Ambulatory Visit: Payer: Federal, State, Local not specified - PPO | Admitting: Physical Therapy

## 2017-05-14 ENCOUNTER — Ambulatory Visit: Payer: Federal, State, Local not specified - PPO | Admitting: Physical Therapy

## 2017-05-14 ENCOUNTER — Encounter: Payer: Self-pay | Admitting: Physical Therapy

## 2017-05-14 DIAGNOSIS — M5441 Lumbago with sciatica, right side: Secondary | ICD-10-CM

## 2017-05-14 DIAGNOSIS — R262 Difficulty in walking, not elsewhere classified: Secondary | ICD-10-CM

## 2017-05-14 NOTE — Therapy (Addendum)
[address] [place], [place], [address] Phone: [phone]   Fax:  [phone]  Physical Therapy Treatment  Patient Details  Name: MARIFER HURD MRN: [id number] Date of Birth: Sep 10, 1966 Referring Provider: Tamala Julian  Encounter Date: 05/14/2017      PT End of Session - 05/14/17 1637    Visit Number 23   Date for PT Re-Evaluation 06/05/17   PT Start Time 1600   PT Stop Time 1645   PT Time Calculation (min) 45 min      Past Medical History:  Diagnosis Date  . Hypertension     Past Surgical History:  Procedure Laterality Date  . ABLATION      There were no vitals filed for this visit.      Subjective Assessment - 05/14/17 1604    Subjective doing well overall, sat alot today so soem increased pain   Currently in Pain? Yes   Pain Score 3    Pain Location Leg   Pain Orientation Right                         [facility] Adult PT Treatment/Exercise - 05/14/17 0001      Knee/Hip Exercises: Aerobic   Elliptical R- 6, I - 8 3 fwd/3 backward      Knee/Hip Exercises: Machines for Strengthening   Cybex Knee Extension 20# 2 sets 15   Cybex Knee Flexion 25# 3x10   Cybex Leg Press 40# 2 sets 15   Other Machine 10# hip  flex,extension and abduction     Knee/Hip Exercises: Standing   Forward Lunges 4 sets   Forward Step Up Both;15 reps;Hand Hold: 2;Step Height: 8"  opp leg ext   Wall Squat 5 sets;10 reps   Rocker Board 3 minutes   Other Standing Knee Exercises standing dead lift 7# 2 sets 15     Knee/Hip Exercises: Supine   Bridges Limitations bridge with ball 15 Sl  and 15 bent knee     Knee/Hip Exercises: Prone   Hip Extension Strengthening;Both;20 reps;2 sets  in plank                  PT Short Term Goals - 02/12/17 1010      PT SHORT TERM GOAL #1   Title independent with initial HEP   Status Achieved           PT Long Term Goals - 05/14/17 1637       PT LONG TERM GOAL #1   Title walk all distances without assistive device   Status Achieved     PT LONG TERM GOAL #2   Title increase strength of the right LE to 4+/5   Status Achieved     PT LONG TERM GOAL #3   Title decrease pain 50%     PT LONG TERM GOAL #4   Title increase SLR of the right LE to 70 degrees   Status Achieved               Plan - 05/14/17 1638    Clinical Impression Statement all goals met. pt to try to resume normal walking and riding bike. if all goes well D/C next visit   PT Next Visit Plan Possible D/C if all going well. Practice lifting for BM and safety      Patient will benefit from skilled therapeutic intervention in order to improve the following  deficits and impairments:     Visit Diagnosis: Acute right-sided low back pain with right-sided sciatica  Difficulty in walking, not elsewhere classified     Problem List There are no active problems to display for this patient.   05/20/2017  PHYSICAL THERAPY DISCHARGE SUMMARY  Visits from Start of Care: 23 Plan: Patient agrees to discharge.  Patient goals were met. Patient is being discharged due to meeting the stated rehab goals.  ?????     Aum Caggiano,ANGIE PTA 05/14/2017, 4:44 PM  [place] [address] [place], [place], [address] Phone: [phone]   Fax:  [phone]  Name: MATSUKO KRETZ MRN: [id number] Date of Birth: 09-25-1966

## 2017-05-18 ENCOUNTER — Ambulatory Visit: Payer: Federal, State, Local not specified - PPO | Admitting: Physical Therapy

## 2017-05-20 ENCOUNTER — Ambulatory Visit: Payer: Federal, State, Local not specified - PPO | Admitting: Physical Therapy

## 2017-06-29 DIAGNOSIS — G5722 Lesion of femoral nerve, left lower limb: Secondary | ICD-10-CM | POA: Diagnosis not present

## 2017-06-29 DIAGNOSIS — M5416 Radiculopathy, lumbar region: Secondary | ICD-10-CM | POA: Diagnosis not present

## 2017-06-29 DIAGNOSIS — M62830 Muscle spasm of back: Secondary | ICD-10-CM | POA: Diagnosis not present

## 2017-06-29 DIAGNOSIS — S76311S Strain of muscle, fascia and tendon of the posterior muscle group at thigh level, right thigh, sequela: Secondary | ICD-10-CM | POA: Diagnosis not present

## 2017-07-14 DIAGNOSIS — F5101 Primary insomnia: Secondary | ICD-10-CM | POA: Diagnosis not present

## 2017-07-14 DIAGNOSIS — I10 Essential (primary) hypertension: Secondary | ICD-10-CM | POA: Diagnosis not present

## 2017-08-05 DIAGNOSIS — F410 Panic disorder [episodic paroxysmal anxiety] without agoraphobia: Secondary | ICD-10-CM | POA: Diagnosis not present

## 2017-08-05 DIAGNOSIS — F4310 Post-traumatic stress disorder, unspecified: Secondary | ICD-10-CM | POA: Diagnosis not present

## 2017-12-25 DIAGNOSIS — F5101 Primary insomnia: Secondary | ICD-10-CM | POA: Diagnosis not present

## 2017-12-25 DIAGNOSIS — Z1231 Encounter for screening mammogram for malignant neoplasm of breast: Secondary | ICD-10-CM | POA: Diagnosis not present

## 2017-12-25 DIAGNOSIS — M545 Low back pain: Secondary | ICD-10-CM | POA: Diagnosis not present

## 2017-12-25 DIAGNOSIS — Z0000 Encounter for general adult medical examination without abnormal findings: Secondary | ICD-10-CM | POA: Diagnosis not present

## 2017-12-25 DIAGNOSIS — I10 Essential (primary) hypertension: Secondary | ICD-10-CM | POA: Diagnosis not present

## 2017-12-25 DIAGNOSIS — Z1389 Encounter for screening for other disorder: Secondary | ICD-10-CM | POA: Diagnosis not present

## 2018-01-27 DIAGNOSIS — F4310 Post-traumatic stress disorder, unspecified: Secondary | ICD-10-CM | POA: Diagnosis not present

## 2018-01-27 DIAGNOSIS — F410 Panic disorder [episodic paroxysmal anxiety] without agoraphobia: Secondary | ICD-10-CM | POA: Diagnosis not present

## 2018-02-05 DIAGNOSIS — M9901 Segmental and somatic dysfunction of cervical region: Secondary | ICD-10-CM | POA: Diagnosis not present

## 2018-02-05 DIAGNOSIS — M5412 Radiculopathy, cervical region: Secondary | ICD-10-CM | POA: Diagnosis not present

## 2018-02-05 DIAGNOSIS — M542 Cervicalgia: Secondary | ICD-10-CM | POA: Diagnosis not present

## 2018-02-05 DIAGNOSIS — M9902 Segmental and somatic dysfunction of thoracic region: Secondary | ICD-10-CM | POA: Diagnosis not present

## 2018-02-11 DIAGNOSIS — M9902 Segmental and somatic dysfunction of thoracic region: Secondary | ICD-10-CM | POA: Diagnosis not present

## 2018-02-11 DIAGNOSIS — M9901 Segmental and somatic dysfunction of cervical region: Secondary | ICD-10-CM | POA: Diagnosis not present

## 2018-02-11 DIAGNOSIS — M542 Cervicalgia: Secondary | ICD-10-CM | POA: Diagnosis not present

## 2018-02-11 DIAGNOSIS — M5412 Radiculopathy, cervical region: Secondary | ICD-10-CM | POA: Diagnosis not present

## 2018-02-15 DIAGNOSIS — M542 Cervicalgia: Secondary | ICD-10-CM | POA: Diagnosis not present

## 2018-02-15 DIAGNOSIS — M9902 Segmental and somatic dysfunction of thoracic region: Secondary | ICD-10-CM | POA: Diagnosis not present

## 2018-02-15 DIAGNOSIS — M9901 Segmental and somatic dysfunction of cervical region: Secondary | ICD-10-CM | POA: Diagnosis not present

## 2018-02-15 DIAGNOSIS — M5412 Radiculopathy, cervical region: Secondary | ICD-10-CM | POA: Diagnosis not present

## 2018-02-22 DIAGNOSIS — M542 Cervicalgia: Secondary | ICD-10-CM | POA: Diagnosis not present

## 2018-02-22 DIAGNOSIS — M9901 Segmental and somatic dysfunction of cervical region: Secondary | ICD-10-CM | POA: Diagnosis not present

## 2018-02-22 DIAGNOSIS — M5412 Radiculopathy, cervical region: Secondary | ICD-10-CM | POA: Diagnosis not present

## 2018-02-22 DIAGNOSIS — M9902 Segmental and somatic dysfunction of thoracic region: Secondary | ICD-10-CM | POA: Diagnosis not present

## 2018-02-24 DIAGNOSIS — M5412 Radiculopathy, cervical region: Secondary | ICD-10-CM | POA: Diagnosis not present

## 2018-02-24 DIAGNOSIS — M542 Cervicalgia: Secondary | ICD-10-CM | POA: Diagnosis not present

## 2018-02-24 DIAGNOSIS — M9901 Segmental and somatic dysfunction of cervical region: Secondary | ICD-10-CM | POA: Diagnosis not present

## 2018-02-24 DIAGNOSIS — M9902 Segmental and somatic dysfunction of thoracic region: Secondary | ICD-10-CM | POA: Diagnosis not present

## 2018-02-25 DIAGNOSIS — M9901 Segmental and somatic dysfunction of cervical region: Secondary | ICD-10-CM | POA: Diagnosis not present

## 2018-02-25 DIAGNOSIS — M5412 Radiculopathy, cervical region: Secondary | ICD-10-CM | POA: Diagnosis not present

## 2018-02-25 DIAGNOSIS — M9902 Segmental and somatic dysfunction of thoracic region: Secondary | ICD-10-CM | POA: Diagnosis not present

## 2018-02-25 DIAGNOSIS — M542 Cervicalgia: Secondary | ICD-10-CM | POA: Diagnosis not present

## 2018-03-01 DIAGNOSIS — M9901 Segmental and somatic dysfunction of cervical region: Secondary | ICD-10-CM | POA: Diagnosis not present

## 2018-03-01 DIAGNOSIS — M9902 Segmental and somatic dysfunction of thoracic region: Secondary | ICD-10-CM | POA: Diagnosis not present

## 2018-03-01 DIAGNOSIS — M5412 Radiculopathy, cervical region: Secondary | ICD-10-CM | POA: Diagnosis not present

## 2018-03-01 DIAGNOSIS — M542 Cervicalgia: Secondary | ICD-10-CM | POA: Diagnosis not present

## 2018-03-04 DIAGNOSIS — M9902 Segmental and somatic dysfunction of thoracic region: Secondary | ICD-10-CM | POA: Diagnosis not present

## 2018-03-04 DIAGNOSIS — M5412 Radiculopathy, cervical region: Secondary | ICD-10-CM | POA: Diagnosis not present

## 2018-03-04 DIAGNOSIS — M542 Cervicalgia: Secondary | ICD-10-CM | POA: Diagnosis not present

## 2018-03-04 DIAGNOSIS — M9901 Segmental and somatic dysfunction of cervical region: Secondary | ICD-10-CM | POA: Diagnosis not present

## 2018-03-08 DIAGNOSIS — M9901 Segmental and somatic dysfunction of cervical region: Secondary | ICD-10-CM | POA: Diagnosis not present

## 2018-03-08 DIAGNOSIS — M9902 Segmental and somatic dysfunction of thoracic region: Secondary | ICD-10-CM | POA: Diagnosis not present

## 2018-03-08 DIAGNOSIS — M542 Cervicalgia: Secondary | ICD-10-CM | POA: Diagnosis not present

## 2018-03-08 DIAGNOSIS — M5412 Radiculopathy, cervical region: Secondary | ICD-10-CM | POA: Diagnosis not present

## 2018-03-11 DIAGNOSIS — M9901 Segmental and somatic dysfunction of cervical region: Secondary | ICD-10-CM | POA: Diagnosis not present

## 2018-03-11 DIAGNOSIS — M542 Cervicalgia: Secondary | ICD-10-CM | POA: Diagnosis not present

## 2018-03-11 DIAGNOSIS — M5412 Radiculopathy, cervical region: Secondary | ICD-10-CM | POA: Diagnosis not present

## 2018-03-11 DIAGNOSIS — M9902 Segmental and somatic dysfunction of thoracic region: Secondary | ICD-10-CM | POA: Diagnosis not present

## 2018-03-15 DIAGNOSIS — M5412 Radiculopathy, cervical region: Secondary | ICD-10-CM | POA: Diagnosis not present

## 2018-03-15 DIAGNOSIS — M9901 Segmental and somatic dysfunction of cervical region: Secondary | ICD-10-CM | POA: Diagnosis not present

## 2018-03-15 DIAGNOSIS — M542 Cervicalgia: Secondary | ICD-10-CM | POA: Diagnosis not present

## 2018-03-15 DIAGNOSIS — M9902 Segmental and somatic dysfunction of thoracic region: Secondary | ICD-10-CM | POA: Diagnosis not present

## 2018-03-18 DIAGNOSIS — M5412 Radiculopathy, cervical region: Secondary | ICD-10-CM | POA: Diagnosis not present

## 2018-03-18 DIAGNOSIS — M9901 Segmental and somatic dysfunction of cervical region: Secondary | ICD-10-CM | POA: Diagnosis not present

## 2018-03-18 DIAGNOSIS — M542 Cervicalgia: Secondary | ICD-10-CM | POA: Diagnosis not present

## 2018-03-18 DIAGNOSIS — M9902 Segmental and somatic dysfunction of thoracic region: Secondary | ICD-10-CM | POA: Diagnosis not present

## 2018-03-22 DIAGNOSIS — M9901 Segmental and somatic dysfunction of cervical region: Secondary | ICD-10-CM | POA: Diagnosis not present

## 2018-03-22 DIAGNOSIS — M542 Cervicalgia: Secondary | ICD-10-CM | POA: Diagnosis not present

## 2018-03-22 DIAGNOSIS — M9902 Segmental and somatic dysfunction of thoracic region: Secondary | ICD-10-CM | POA: Diagnosis not present

## 2018-03-22 DIAGNOSIS — M5412 Radiculopathy, cervical region: Secondary | ICD-10-CM | POA: Diagnosis not present

## 2018-05-17 DIAGNOSIS — M25512 Pain in left shoulder: Secondary | ICD-10-CM | POA: Diagnosis not present

## 2018-05-24 DIAGNOSIS — M25512 Pain in left shoulder: Secondary | ICD-10-CM | POA: Diagnosis not present

## 2018-07-08 DIAGNOSIS — I10 Essential (primary) hypertension: Secondary | ICD-10-CM | POA: Diagnosis not present

## 2018-07-08 DIAGNOSIS — F5101 Primary insomnia: Secondary | ICD-10-CM | POA: Diagnosis not present

## 2018-07-08 DIAGNOSIS — M545 Low back pain: Secondary | ICD-10-CM | POA: Diagnosis not present

## 2018-07-08 DIAGNOSIS — Z6830 Body mass index (BMI) 30.0-30.9, adult: Secondary | ICD-10-CM | POA: Diagnosis not present

## 2018-07-22 DIAGNOSIS — F410 Panic disorder [episodic paroxysmal anxiety] without agoraphobia: Secondary | ICD-10-CM | POA: Diagnosis not present

## 2018-11-22 DIAGNOSIS — N951 Menopausal and female climacteric states: Secondary | ICD-10-CM | POA: Diagnosis not present

## 2018-11-22 DIAGNOSIS — R635 Abnormal weight gain: Secondary | ICD-10-CM | POA: Diagnosis not present

## 2018-12-01 DIAGNOSIS — Z1339 Encounter for screening examination for other mental health and behavioral disorders: Secondary | ICD-10-CM | POA: Diagnosis not present

## 2018-12-01 DIAGNOSIS — R635 Abnormal weight gain: Secondary | ICD-10-CM | POA: Diagnosis not present

## 2018-12-01 DIAGNOSIS — Z1331 Encounter for screening for depression: Secondary | ICD-10-CM | POA: Diagnosis not present

## 2018-12-01 DIAGNOSIS — I10 Essential (primary) hypertension: Secondary | ICD-10-CM | POA: Diagnosis not present

## 2018-12-01 DIAGNOSIS — E7800 Pure hypercholesterolemia, unspecified: Secondary | ICD-10-CM | POA: Diagnosis not present

## 2018-12-01 DIAGNOSIS — N951 Menopausal and female climacteric states: Secondary | ICD-10-CM | POA: Diagnosis not present

## 2018-12-20 DIAGNOSIS — R6882 Decreased libido: Secondary | ICD-10-CM | POA: Diagnosis not present

## 2018-12-20 DIAGNOSIS — N951 Menopausal and female climacteric states: Secondary | ICD-10-CM | POA: Diagnosis not present

## 2018-12-20 DIAGNOSIS — I10 Essential (primary) hypertension: Secondary | ICD-10-CM | POA: Diagnosis not present

## 2018-12-20 DIAGNOSIS — G479 Sleep disorder, unspecified: Secondary | ICD-10-CM | POA: Diagnosis not present

## 2018-12-29 DIAGNOSIS — F5101 Primary insomnia: Secondary | ICD-10-CM | POA: Diagnosis not present

## 2018-12-29 DIAGNOSIS — Z0000 Encounter for general adult medical examination without abnormal findings: Secondary | ICD-10-CM | POA: Diagnosis not present

## 2018-12-29 DIAGNOSIS — E663 Overweight: Secondary | ICD-10-CM | POA: Diagnosis not present

## 2018-12-29 DIAGNOSIS — I10 Essential (primary) hypertension: Secondary | ICD-10-CM | POA: Diagnosis not present

## 2018-12-29 DIAGNOSIS — Z1389 Encounter for screening for other disorder: Secondary | ICD-10-CM | POA: Diagnosis not present

## 2018-12-29 DIAGNOSIS — Z1231 Encounter for screening mammogram for malignant neoplasm of breast: Secondary | ICD-10-CM | POA: Diagnosis not present

## 2018-12-29 DIAGNOSIS — Z6829 Body mass index (BMI) 29.0-29.9, adult: Secondary | ICD-10-CM | POA: Diagnosis not present

## 2019-02-09 DIAGNOSIS — F410 Panic disorder [episodic paroxysmal anxiety] without agoraphobia: Secondary | ICD-10-CM | POA: Diagnosis not present

## 2019-02-20 ENCOUNTER — Encounter (HOSPITAL_COMMUNITY): Payer: Self-pay | Admitting: Emergency Medicine

## 2019-02-20 ENCOUNTER — Inpatient Hospital Stay (HOSPITAL_COMMUNITY)
Admission: EM | Admit: 2019-02-20 | Discharge: 2019-03-04 | DRG: 308 | Disposition: E | Payer: Federal, State, Local not specified - PPO | Attending: Internal Medicine | Admitting: Internal Medicine

## 2019-02-20 DIAGNOSIS — Z515 Encounter for palliative care: Secondary | ICD-10-CM | POA: Diagnosis not present

## 2019-02-20 DIAGNOSIS — J690 Pneumonitis due to inhalation of food and vomit: Secondary | ICD-10-CM

## 2019-02-20 DIAGNOSIS — I4510 Unspecified right bundle-branch block: Secondary | ICD-10-CM | POA: Diagnosis present

## 2019-02-20 DIAGNOSIS — I469 Cardiac arrest, cause unspecified: Secondary | ICD-10-CM | POA: Diagnosis not present

## 2019-02-20 DIAGNOSIS — Z66 Do not resuscitate: Secondary | ICD-10-CM | POA: Diagnosis not present

## 2019-02-20 DIAGNOSIS — E8729 Other acidosis: Secondary | ICD-10-CM

## 2019-02-20 DIAGNOSIS — D65 Disseminated intravascular coagulation [defibrination syndrome]: Secondary | ICD-10-CM

## 2019-02-20 DIAGNOSIS — R402312 Coma scale, best motor response, none, at arrival to emergency department: Secondary | ICD-10-CM | POA: Diagnosis not present

## 2019-02-20 DIAGNOSIS — G931 Anoxic brain damage, not elsewhere classified: Secondary | ICD-10-CM | POA: Diagnosis not present

## 2019-02-20 DIAGNOSIS — Z20828 Contact with and (suspected) exposure to other viral communicable diseases: Secondary | ICD-10-CM | POA: Diagnosis not present

## 2019-02-20 DIAGNOSIS — J9600 Acute respiratory failure, unspecified whether with hypoxia or hypercapnia: Secondary | ICD-10-CM | POA: Diagnosis not present

## 2019-02-20 DIAGNOSIS — R402212 Coma scale, best verbal response, none, at arrival to emergency department: Secondary | ICD-10-CM | POA: Diagnosis not present

## 2019-02-20 DIAGNOSIS — E872 Acidosis: Secondary | ICD-10-CM | POA: Diagnosis present

## 2019-02-20 DIAGNOSIS — F10129 Alcohol abuse with intoxication, unspecified: Secondary | ICD-10-CM | POA: Diagnosis present

## 2019-02-20 DIAGNOSIS — Z79899 Other long term (current) drug therapy: Secondary | ICD-10-CM

## 2019-02-20 DIAGNOSIS — R570 Cardiogenic shock: Secondary | ICD-10-CM | POA: Diagnosis present

## 2019-02-20 DIAGNOSIS — N179 Acute kidney failure, unspecified: Secondary | ICD-10-CM | POA: Diagnosis not present

## 2019-02-20 DIAGNOSIS — Z888 Allergy status to other drugs, medicaments and biological substances status: Secondary | ICD-10-CM

## 2019-02-20 DIAGNOSIS — R402112 Coma scale, eyes open, never, at arrival to emergency department: Secondary | ICD-10-CM | POA: Diagnosis present

## 2019-02-20 DIAGNOSIS — I10 Essential (primary) hypertension: Secondary | ICD-10-CM | POA: Diagnosis present

## 2019-02-20 DIAGNOSIS — R404 Transient alteration of awareness: Secondary | ICD-10-CM | POA: Diagnosis not present

## 2019-02-20 DIAGNOSIS — F1010 Alcohol abuse, uncomplicated: Secondary | ICD-10-CM | POA: Diagnosis present

## 2019-02-20 DIAGNOSIS — I4901 Ventricular fibrillation: Secondary | ICD-10-CM | POA: Diagnosis not present

## 2019-02-20 DIAGNOSIS — R579 Shock, unspecified: Secondary | ICD-10-CM

## 2019-02-20 DIAGNOSIS — Z452 Encounter for adjustment and management of vascular access device: Secondary | ICD-10-CM

## 2019-02-20 DIAGNOSIS — R918 Other nonspecific abnormal finding of lung field: Secondary | ICD-10-CM | POA: Diagnosis not present

## 2019-02-20 DIAGNOSIS — F10929 Alcohol use, unspecified with intoxication, unspecified: Secondary | ICD-10-CM

## 2019-02-20 DIAGNOSIS — R778 Other specified abnormalities of plasma proteins: Secondary | ICD-10-CM

## 2019-02-20 DIAGNOSIS — R680 Hypothermia, not associated with low environmental temperature: Secondary | ICD-10-CM | POA: Diagnosis not present

## 2019-02-20 DIAGNOSIS — R4020 Unspecified coma: Secondary | ICD-10-CM | POA: Diagnosis not present

## 2019-02-20 DIAGNOSIS — R7989 Other specified abnormal findings of blood chemistry: Secondary | ICD-10-CM

## 2019-02-20 DIAGNOSIS — E876 Hypokalemia: Secondary | ICD-10-CM | POA: Diagnosis present

## 2019-02-20 DIAGNOSIS — D72829 Elevated white blood cell count, unspecified: Secondary | ICD-10-CM | POA: Diagnosis present

## 2019-02-20 DIAGNOSIS — I499 Cardiac arrhythmia, unspecified: Secondary | ICD-10-CM | POA: Diagnosis not present

## 2019-02-20 DIAGNOSIS — F17200 Nicotine dependence, unspecified, uncomplicated: Secondary | ICD-10-CM | POA: Diagnosis present

## 2019-02-20 DIAGNOSIS — R000 Tachycardia, unspecified: Secondary | ICD-10-CM | POA: Diagnosis not present

## 2019-02-20 MED ORDER — ETOMIDATE 2 MG/ML IV SOLN
INTRAVENOUS | Status: AC | PRN
  Administered 2019-02-20: 100 mg via INTRAVENOUS

## 2019-02-20 MED ORDER — ROCURONIUM BROMIDE 50 MG/5ML IV SOLN
INTRAVENOUS | Status: AC | PRN
  Administered 2019-02-20: 80 mg via INTRAVENOUS

## 2019-02-20 MED ORDER — SODIUM CHLORIDE 0.9% FLUSH: 3.0000 mL | Freq: Once | INTRAVENOUS | Status: DC

## 2019-02-20 NOTE — Code Documentation (Signed)
Intubating pt at this time 

## 2019-02-20 NOTE — ED Triage Notes (Signed)
Per EMS, drinking w/ husband, placed in car for 15 minutes, husband found snoring resp.  He drove home another 10 minutes, could not get out of car, called fire.  They found pulseless, shocked, 3 rounds of CPR, 9 epi, 700 NS, 100 epi drip.  Lungs sound full.  Pulses w/ arrival

## 2019-02-20 NOTE — ED Notes (Signed)
EPI drip going, still have pulses.  Fluids started,

## 2019-02-21 ENCOUNTER — Inpatient Hospital Stay (HOSPITAL_COMMUNITY): Payer: Federal, State, Local not specified - PPO

## 2019-02-21 ENCOUNTER — Encounter (HOSPITAL_COMMUNITY): Payer: Self-pay | Admitting: Emergency Medicine

## 2019-02-21 ENCOUNTER — Emergency Department (HOSPITAL_COMMUNITY): Payer: Federal, State, Local not specified - PPO

## 2019-02-21 DIAGNOSIS — R570 Cardiogenic shock: Secondary | ICD-10-CM | POA: Diagnosis present

## 2019-02-21 DIAGNOSIS — E872 Acidosis: Secondary | ICD-10-CM | POA: Diagnosis present

## 2019-02-21 DIAGNOSIS — F10929 Alcohol use, unspecified with intoxication, unspecified: Secondary | ICD-10-CM

## 2019-02-21 DIAGNOSIS — Z66 Do not resuscitate: Secondary | ICD-10-CM | POA: Diagnosis not present

## 2019-02-21 DIAGNOSIS — D65 Disseminated intravascular coagulation [defibrination syndrome]: Secondary | ICD-10-CM | POA: Diagnosis present

## 2019-02-21 DIAGNOSIS — Z515 Encounter for palliative care: Secondary | ICD-10-CM | POA: Diagnosis not present

## 2019-02-21 DIAGNOSIS — I4901 Ventricular fibrillation: Secondary | ICD-10-CM | POA: Diagnosis present

## 2019-02-21 DIAGNOSIS — R402112 Coma scale, eyes open, never, at arrival to emergency department: Secondary | ICD-10-CM | POA: Diagnosis present

## 2019-02-21 DIAGNOSIS — R918 Other nonspecific abnormal finding of lung field: Secondary | ICD-10-CM | POA: Diagnosis not present

## 2019-02-21 DIAGNOSIS — F17200 Nicotine dependence, unspecified, uncomplicated: Secondary | ICD-10-CM | POA: Diagnosis present

## 2019-02-21 DIAGNOSIS — R402312 Coma scale, best motor response, none, at arrival to emergency department: Secondary | ICD-10-CM | POA: Diagnosis present

## 2019-02-21 DIAGNOSIS — F10129 Alcohol abuse with intoxication, unspecified: Secondary | ICD-10-CM | POA: Diagnosis present

## 2019-02-21 DIAGNOSIS — Z452 Encounter for adjustment and management of vascular access device: Secondary | ICD-10-CM | POA: Diagnosis not present

## 2019-02-21 DIAGNOSIS — Z888 Allergy status to other drugs, medicaments and biological substances status: Secondary | ICD-10-CM | POA: Diagnosis not present

## 2019-02-21 DIAGNOSIS — G931 Anoxic brain damage, not elsewhere classified: Secondary | ICD-10-CM | POA: Diagnosis not present

## 2019-02-21 DIAGNOSIS — J690 Pneumonitis due to inhalation of food and vomit: Secondary | ICD-10-CM

## 2019-02-21 DIAGNOSIS — Z79899 Other long term (current) drug therapy: Secondary | ICD-10-CM | POA: Diagnosis not present

## 2019-02-21 DIAGNOSIS — I4510 Unspecified right bundle-branch block: Secondary | ICD-10-CM | POA: Diagnosis present

## 2019-02-21 DIAGNOSIS — R402212 Coma scale, best verbal response, none, at arrival to emergency department: Secondary | ICD-10-CM | POA: Diagnosis present

## 2019-02-21 DIAGNOSIS — N179 Acute kidney failure, unspecified: Secondary | ICD-10-CM | POA: Diagnosis not present

## 2019-02-21 DIAGNOSIS — J9600 Acute respiratory failure, unspecified whether with hypoxia or hypercapnia: Secondary | ICD-10-CM | POA: Diagnosis present

## 2019-02-21 DIAGNOSIS — I469 Cardiac arrest, cause unspecified: Secondary | ICD-10-CM | POA: Diagnosis not present

## 2019-02-21 DIAGNOSIS — F1010 Alcohol abuse, uncomplicated: Secondary | ICD-10-CM | POA: Diagnosis present

## 2019-02-21 DIAGNOSIS — R680 Hypothermia, not associated with low environmental temperature: Secondary | ICD-10-CM | POA: Diagnosis not present

## 2019-02-21 DIAGNOSIS — I10 Essential (primary) hypertension: Secondary | ICD-10-CM | POA: Diagnosis present

## 2019-02-21 DIAGNOSIS — E876 Hypokalemia: Secondary | ICD-10-CM | POA: Diagnosis present

## 2019-02-21 DIAGNOSIS — Z20828 Contact with and (suspected) exposure to other viral communicable diseases: Secondary | ICD-10-CM | POA: Diagnosis present

## 2019-02-21 DIAGNOSIS — R7989 Other specified abnormal findings of blood chemistry: Secondary | ICD-10-CM

## 2019-02-21 LAB — BASIC METABOLIC PANEL
Anion gap: 23 — ABNORMAL HIGH (ref 5–15)
BUN: 10 mg/dL (ref 6–20)
CO2: 8 mmol/L — ABNORMAL LOW (ref 22–32)
Calcium: 7.1 mg/dL — ABNORMAL LOW (ref 8.9–10.3)
Chloride: 102 mmol/L (ref 98–111)
Creatinine, Ser: 1.24 mg/dL — ABNORMAL HIGH (ref 0.44–1.00)
GFR calc Af Amer: 57 mL/min — ABNORMAL LOW (ref >60)
GFR calc non Af Amer: 50 mL/min — ABNORMAL LOW (ref >60)
Glucose, Bld: 383 mg/dL — ABNORMAL HIGH (ref 70–99)
Potassium: 3 mmol/L — ABNORMAL LOW (ref 3.5–5.1)
Sodium: 133 mmol/L — ABNORMAL LOW (ref 135–145)

## 2019-02-21 LAB — CBC WITH DIFFERENTIAL/PLATELET
Abs Immature Granulocytes: 1.7 10*3/uL — ABNORMAL HIGH (ref 0.00–0.07)
Basophils Absolute: 0 10*3/uL (ref 0.0–0.1)
Basophils Relative: 0 %
Eosinophils Absolute: 0 10*3/uL (ref 0.0–0.5)
Eosinophils Relative: 0 %
HCT: 36.8 % (ref 36.0–46.0)
Hemoglobin: 11.2 g/dL — ABNORMAL LOW (ref 12.0–15.0)
Lymphocytes Relative: 18 %
Lymphs Abs: 3.1 10*3/uL (ref 0.7–4.0)
MCH: 31.4 pg (ref 26.0–34.0)
MCHC: 30.4 g/dL (ref 30.0–36.0)
MCV: 103.1 fL — ABNORMAL HIGH (ref 80.0–100.0)
Metamyelocytes Relative: 5 %
Monocytes Absolute: 0.2 10*3/uL (ref 0.1–1.0)
Monocytes Relative: 1 %
Myelocytes: 4 %
Neutro Abs: 12.3 10*3/uL — ABNORMAL HIGH (ref 1.7–7.7)
Neutrophils Relative %: 71 %
Platelets: 124 10*3/uL — ABNORMAL LOW (ref 150–400)
Promyelocytes Relative: 1 %
RBC: 3.57 MIL/uL — ABNORMAL LOW (ref 3.87–5.11)
RDW: 13.9 % (ref 11.5–15.5)
WBC: 17.3 10*3/uL — ABNORMAL HIGH (ref 4.0–10.5)
nRBC: 0.2 % (ref 0.0–0.2)
nRBC: 1 /100 WBC — ABNORMAL HIGH

## 2019-02-21 LAB — TRIGLYCERIDES: Triglycerides: 273 mg/dL — ABNORMAL HIGH (ref <150)

## 2019-02-21 LAB — CBC
HCT: 48.7 % — ABNORMAL HIGH (ref 36.0–46.0)
Hemoglobin: 15.6 g/dL — ABNORMAL HIGH (ref 12.0–15.0)
MCH: 31.5 pg (ref 26.0–34.0)
MCHC: 32 g/dL (ref 30.0–36.0)
MCV: 98.2 fL (ref 80.0–100.0)
Platelets: 172 10*3/uL (ref 150–400)
RBC: 4.96 MIL/uL (ref 3.87–5.11)
RDW: 14.1 % (ref 11.5–15.5)
WBC: 18.6 10*3/uL — ABNORMAL HIGH (ref 4.0–10.5)
nRBC: 0.5 % — ABNORMAL HIGH (ref 0.0–0.2)

## 2019-02-21 LAB — COMPREHENSIVE METABOLIC PANEL
ALT: 221 U/L — ABNORMAL HIGH (ref 0–44)
AST: 273 U/L — ABNORMAL HIGH (ref 15–41)
Albumin: 1.6 g/dL — ABNORMAL LOW (ref 3.5–5.0)
Alkaline Phosphatase: 65 U/L (ref 38–126)
Anion gap: 14 (ref 5–15)
BUN: 8 mg/dL (ref 6–20)
CO2: 10 mmol/L — ABNORMAL LOW (ref 22–32)
Calcium: 6 mg/dL — CL (ref 8.9–10.3)
Chloride: 109 mmol/L (ref 98–111)
Creatinine, Ser: 1 mg/dL (ref 0.44–1.00)
GFR calc Af Amer: >60 mL/min (ref >60)
GFR calc non Af Amer: >60 mL/min (ref >60)
Glucose, Bld: 274 mg/dL — ABNORMAL HIGH (ref 70–99)
Potassium: 2.6 mmol/L — CL (ref 3.5–5.1)
Sodium: 133 mmol/L — ABNORMAL LOW (ref 135–145)
Total Bilirubin: 0.3 mg/dL (ref 0.3–1.2)
Total Protein: <3 g/dL — ABNORMAL LOW (ref 6.5–8.1)

## 2019-02-21 LAB — DIC (DISSEMINATED INTRAVASCULAR COAGULATION)PANEL
Platelets: 155 10*3/uL (ref 150–400)
Platelets: 150 10*3/uL (ref 150–400)

## 2019-02-21 LAB — POCT I-STAT 7, (LYTES, BLD GAS, ICA,H+H)
Acid-base deficit: 24 mmol/L — ABNORMAL HIGH (ref 0.0–2.0)
Bicarbonate: 7.7 mmol/L — ABNORMAL LOW (ref 20.0–28.0)
Calcium, Ion: 1.05 mmol/L — ABNORMAL LOW (ref 1.15–1.40)
HCT: 48 % — ABNORMAL HIGH (ref 36.0–46.0)
Hemoglobin: 16.3 g/dL — ABNORMAL HIGH (ref 12.0–15.0)
O2 Saturation: 86 %
Patient temperature: 30.7
Potassium: 3 mmol/L — ABNORMAL LOW (ref 3.5–5.1)
Sodium: 135 mmol/L (ref 135–145)
TCO2: 9 mmol/L — ABNORMAL LOW (ref 22–32)
pCO2 arterial: 26 mmHg — ABNORMAL LOW (ref 32.0–48.0)
pH, Arterial: 7.038 — CL (ref 7.350–7.450)
pO2, Arterial: 52 mmHg — ABNORMAL LOW (ref 83.0–108.0)

## 2019-02-21 LAB — ABO/RH: ABO/RH(D): O POS

## 2019-02-21 LAB — PREPARE CRYOPRECIPITATE: Unit division: 0

## 2019-02-21 LAB — FERRITIN: Ferritin: 1031 ng/mL — ABNORMAL HIGH (ref 11–307)

## 2019-02-21 LAB — URINALYSIS, ROUTINE W REFLEX MICROSCOPIC
Bilirubin Urine: NEGATIVE
Glucose, UA: NEGATIVE mg/dL
Ketones, ur: NEGATIVE mg/dL
Leukocytes,Ua: NEGATIVE
Nitrite: NEGATIVE
Protein, ur: 100 mg/dL — AB
Specific Gravity, Urine: 1.013 (ref 1.005–1.030)
pH: 6 (ref 5.0–8.0)

## 2019-02-21 LAB — DIC (DISSEMINATED INTRAVASCULAR COAGULATION) PANEL
Platelets: 180 10*3/uL (ref 150–400)
Smear Review: NONE SEEN

## 2019-02-21 LAB — CBG MONITORING, ED: Glucose-Capillary: 270 mg/dL — ABNORMAL HIGH (ref 70–99)

## 2019-02-21 LAB — TYPE AND SCREEN
ABO/RH(D): O POS
Antibody Screen: NEGATIVE

## 2019-02-21 LAB — LACTIC ACID, PLASMA
Lactic Acid, Venous: >11 mmol/L (ref 0.5–1.9)
Lactic Acid, Venous: >11 mmol/L (ref 0.5–1.9)
Lactic Acid, Venous: >11 mmol/L (ref 0.5–1.9)

## 2019-02-21 LAB — LACTATE DEHYDROGENASE: LDH: 703 U/L — ABNORMAL HIGH (ref 98–192)

## 2019-02-21 LAB — C-REACTIVE PROTEIN: CRP: <0.8 mg/dL (ref <1.0)

## 2019-02-21 LAB — TROPONIN I: Troponin I: 0.42 ng/mL (ref <0.03)

## 2019-02-21 LAB — ECHOCARDIOGRAM COMPLETE
Height: 67 in
Weight: 3022.95 oz

## 2019-02-21 LAB — RAPID URINE DRUG SCREEN, HOSP PERFORMED
Amphetamines: NOT DETECTED
Barbiturates: NOT DETECTED
Benzodiazepines: NOT DETECTED
Cocaine: NOT DETECTED
Opiates: NOT DETECTED
Tetrahydrocannabinol: NOT DETECTED

## 2019-02-21 LAB — PROCALCITONIN: Procalcitonin: 0.1 ng/mL

## 2019-02-21 LAB — FIBRINOGEN: Fibrinogen: <100 mg/dL — ABNORMAL LOW (ref 210–475)

## 2019-02-21 LAB — ACETAMINOPHEN LEVEL: Acetaminophen (Tylenol), Serum: <10 ug/mL — ABNORMAL LOW (ref 10–30)

## 2019-02-21 LAB — MAGNESIUM
Magnesium: 2.4 mg/dL (ref 1.7–2.4)
Magnesium: 2.2 mg/dL (ref 1.7–2.4)

## 2019-02-21 LAB — BPAM CRYOPRECIPITATE
Blood Product Expiration Date: 202004201152
Unit Type and Rh: 5100

## 2019-02-21 LAB — PROTIME-INR
INR: 3 — ABNORMAL HIGH (ref 0.8–1.2)
Prothrombin Time: 30.4 seconds — ABNORMAL HIGH (ref 11.4–15.2)

## 2019-02-21 LAB — HIV ANTIBODY (ROUTINE TESTING W REFLEX): HIV Screen 4th Generation wRfx: NONREACTIVE

## 2019-02-21 LAB — PHOSPHORUS: Phosphorus: 4.5 mg/dL (ref 2.5–4.6)

## 2019-02-21 LAB — SALICYLATE LEVEL: Salicylate Lvl: <7 mg/dL (ref 2.8–30.0)

## 2019-02-21 LAB — I-STAT BETA HCG BLOOD, ED (MC, WL, AP ONLY): I-stat hCG, quantitative: 8.3 m[IU]/mL — ABNORMAL HIGH (ref <5)

## 2019-02-21 LAB — GLUCOSE, CAPILLARY
Glucose-Capillary: 353 mg/dL — ABNORMAL HIGH (ref 70–99)
Glucose-Capillary: 304 mg/dL — ABNORMAL HIGH (ref 70–99)

## 2019-02-21 LAB — MRSA PCR SCREENING: MRSA by PCR: NEGATIVE

## 2019-02-21 LAB — ETHANOL: Alcohol, Ethyl (B): 388 mg/dL (ref <10)

## 2019-02-21 LAB — SARS CORONAVIRUS 2 BY RT PCR (HOSPITAL ORDER, PERFORMED IN ~~LOC~~ HOSPITAL LAB): SARS Coronavirus 2: NEGATIVE

## 2019-02-21 LAB — D-DIMER, QUANTITATIVE: D-Dimer, Quant: >20 ug/mL-FEU — ABNORMAL HIGH (ref 0.00–0.50)

## 2019-02-21 MED ORDER — MORPHINE SULFATE (PF) 2 MG/ML IV SOLN
2.0000 mg | INTRAVENOUS | Status: DC | PRN
  Administered 2019-02-21: 15:00:00 4 mg via INTRAVENOUS
  Filled 2019-02-21: qty 2

## 2019-02-21 MED ORDER — MORPHINE SULFATE (PF) 4 MG/ML IV SOLN: 4.0000 mg | INTRAVENOUS | Status: DC

## 2019-02-21 MED ORDER — DIPHENHYDRAMINE HCL 50 MG/ML IJ SOLN: 25.0000 mg | INTRAMUSCULAR | Status: DC | PRN

## 2019-02-21 MED ORDER — VANCOMYCIN HCL 10 G IV SOLR
1750.0000 mg | INTRAVENOUS | Status: DC
  Filled 2019-02-21: qty 1750

## 2019-02-21 MED ORDER — NOREPINEPHRINE 4 MG/250ML-% IV SOLN
5.0000 ug/min | INTRAVENOUS | Status: DC
  Administered 2019-02-21 (×4): 50 ug/min via INTRAVENOUS
  Administered 2019-02-21: 5 ug/min via INTRAVENOUS
  Administered 2019-02-21 (×2): 50 ug/min via INTRAVENOUS
  Filled 2019-02-21 (×3): qty 250
  Filled 2019-02-21: qty 500
  Filled 2019-02-21 (×2): qty 250

## 2019-02-21 MED ORDER — POTASSIUM CHLORIDE 10 MEQ/100ML IV SOLN
10.0000 meq | INTRAVENOUS | Status: AC
  Administered 2019-02-21 (×4): 10 meq via INTRAVENOUS
  Filled 2019-02-21 (×5): qty 100

## 2019-02-21 MED ORDER — ENOXAPARIN SODIUM 40 MG/0.4ML ~~LOC~~ SOLN: 40.0000 mg | SUBCUTANEOUS | Status: DC

## 2019-02-21 MED ORDER — DEXTROSE 5 % IV SOLN: INTRAVENOUS | Status: DC

## 2019-02-21 MED ORDER — CHLORHEXIDINE GLUCONATE 0.12% ORAL RINSE (MEDLINE KIT)
15.0000 mL | Freq: Two times a day (BID) | OROMUCOSAL | Status: DC
  Administered 2019-02-21: 09:00:00 15 mL via OROMUCOSAL

## 2019-02-21 MED ORDER — SODIUM CHLORIDE 0.9 % IV SOLN
2.0000 g | Freq: Once | INTRAVENOUS | Status: AC
  Administered 2019-02-21: 2 g via INTRAVENOUS
  Filled 2019-02-21: qty 2

## 2019-02-21 MED ORDER — BISACODYL 10 MG RE SUPP
10.0000 mg | Freq: Every day | RECTAL | Status: DC | PRN
  Filled 2019-02-21: qty 1

## 2019-02-21 MED ORDER — SODIUM CHLORIDE 0.9 % IV SOLN
2.0000 g | Freq: Three times a day (TID) | INTRAVENOUS | Status: DC
  Administered 2019-02-21: 2 g via INTRAVENOUS
  Filled 2019-02-21 (×4): qty 2

## 2019-02-21 MED ORDER — ACETAMINOPHEN 650 MG RE SUPP: 650.0000 mg | Freq: Four times a day (QID) | RECTAL | Status: DC | PRN

## 2019-02-21 MED ORDER — VASOPRESSIN 20 UNIT/ML IV SOLN
0.0300 [IU]/min | INTRAVENOUS | Status: DC
  Administered 2019-02-21: 04:00:00 0.03 [IU]/min via INTRAVENOUS
  Filled 2019-02-21 (×3): qty 2

## 2019-02-21 MED ORDER — CALCIUM GLUCONATE-NACL 2-0.675 GM/100ML-% IV SOLN
2.0000 g | INTRAVENOUS | Status: AC
  Administered 2019-02-21: 04:00:00 2000 mg via INTRAVENOUS
  Filled 2019-02-21: qty 100

## 2019-02-21 MED ORDER — DOCUSATE SODIUM 50 MG/5ML PO LIQD
100.0000 mg | Freq: Two times a day (BID) | ORAL | Status: DC | PRN
  Filled 2019-02-21: qty 10

## 2019-02-21 MED ORDER — SODIUM CHLORIDE 0.9 % IV SOLN
INTRAVENOUS | Status: AC | PRN
  Administered 2019-02-21 (×3): 1000 mL via INTRAVENOUS

## 2019-02-21 MED ORDER — ONDANSETRON HCL 4 MG/2ML IJ SOLN: 4.0000 mg | Freq: Four times a day (QID) | INTRAMUSCULAR | Status: DC | PRN

## 2019-02-21 MED ORDER — POTASSIUM CHLORIDE 20 MEQ/15ML (10%) PO SOLN
80.0000 meq | Freq: Once | ORAL | Status: DC
  Filled 2019-02-21: qty 60

## 2019-02-21 MED ORDER — NOREPINEPHRINE 16 MG/250ML-% IV SOLN
5.0000 ug/min | INTRAVENOUS | Status: DC
  Filled 2019-02-21: qty 250

## 2019-02-21 MED ORDER — FAMOTIDINE IN NACL 20-0.9 MG/50ML-% IV SOLN
20.0000 mg | Freq: Two times a day (BID) | INTRAVENOUS | Status: DC
  Administered 2019-02-21 (×2): 20 mg via INTRAVENOUS
  Filled 2019-02-21 (×3): qty 50

## 2019-02-21 MED ORDER — POLYVINYL ALCOHOL 1.4 % OP SOLN
1.0000 [drp] | Freq: Four times a day (QID) | OPHTHALMIC | Status: DC | PRN
  Filled 2019-02-21: qty 15

## 2019-02-21 MED ORDER — SODIUM BICARBONATE 8.4 % IV SOLN
50.0000 meq | Freq: Once | INTRAVENOUS | Status: AC
  Administered 2019-02-21: 50 meq via INTRAVENOUS
  Filled 2019-02-21: qty 50

## 2019-02-21 MED ORDER — GLYCOPYRROLATE 0.2 MG/ML IJ SOLN: 0.2000 mg | INTRAMUSCULAR | Status: DC | PRN

## 2019-02-21 MED ORDER — GLYCOPYRROLATE 1 MG PO TABS
1.0000 mg | ORAL_TABLET | ORAL | Status: DC | PRN
  Filled 2019-02-21: qty 1

## 2019-02-21 MED ORDER — SODIUM BICARBONATE 8.4 % IV SOLN
200.0000 meq | Freq: Once | INTRAVENOUS | Status: AC
  Administered 2019-02-21: 200 meq via INTRAVENOUS
  Filled 2019-02-21: qty 200

## 2019-02-21 MED ORDER — EPINEPHRINE PF 1 MG/ML IJ SOLN
0.5000 ug/min | INTRAVENOUS | Status: DC
  Administered 2019-02-21: 0.5 ug/min via INTRAVENOUS
  Filled 2019-02-21: qty 4

## 2019-02-21 MED ORDER — HYDROCORTISONE NA SUCCINATE PF 100 MG IJ SOLR
50.0000 mg | Freq: Four times a day (QID) | INTRAMUSCULAR | Status: DC
  Administered 2019-02-21 (×2): 50 mg via INTRAVENOUS
  Filled 2019-02-21 (×2): qty 2

## 2019-02-21 MED ORDER — MORPHINE SULFATE (PF) 4 MG/ML IV SOLN
4.0000 mg | INTRAVENOUS | Status: DC | PRN
  Administered 2019-02-21: 14:00:00 4 mg via INTRAVENOUS
  Filled 2019-02-21: qty 1

## 2019-02-21 MED ORDER — FENTANYL CITRATE (PF) 100 MCG/2ML IJ SOLN: 50.0000 ug | INTRAMUSCULAR | Status: DC | PRN

## 2019-02-21 MED ORDER — VANCOMYCIN HCL 10 G IV SOLR
1750.0000 mg | Freq: Once | INTRAVENOUS | Status: AC
  Administered 2019-02-21: 1750 mg via INTRAVENOUS
  Filled 2019-02-21: qty 1750

## 2019-02-21 MED ORDER — ORAL CARE MOUTH RINSE
15.0000 mL | OROMUCOSAL | Status: DC
  Administered 2019-02-21 (×2): 15 mL via OROMUCOSAL

## 2019-02-21 MED ORDER — EPINEPHRINE PF 1 MG/ML IJ SOLN
0.5000 ug/min | INTRAVENOUS | Status: DC
  Administered 2019-02-21: 20 ug/min via INTRAVENOUS
  Filled 2019-02-21: qty 4

## 2019-02-21 MED ORDER — POTASSIUM CHLORIDE CRYS ER 20 MEQ PO TBCR: 80.0000 meq | EXTENDED_RELEASE_TABLET | Freq: Once | ORAL | Status: DC

## 2019-02-21 MED ORDER — VANCOMYCIN HCL IN DEXTROSE 1-5 GM/200ML-% IV SOLN: 1000.0000 mg | Freq: Once | INTRAVENOUS | Status: DC

## 2019-02-21 MED ORDER — ACETAMINOPHEN 325 MG PO TABS: 650.0000 mg | ORAL_TABLET | Freq: Four times a day (QID) | ORAL | Status: DC | PRN

## 2019-02-21 MED ORDER — SODIUM BICARBONATE 8.4 % IV SOLN
INTRAVENOUS | Status: DC
  Administered 2019-02-21 (×2): via INTRAVENOUS
  Filled 2019-02-21 (×3): qty 150

## 2019-02-21 MED ORDER — SODIUM CHLORIDE 0.9% IV SOLUTION: Freq: Once | INTRAVENOUS | Status: DC

## 2019-02-21 MED ORDER — SODIUM CHLORIDE 0.9 % IV SOLN
INTRAVENOUS | Status: DC | PRN
  Administered 2019-02-21: 06:00:00 via INTRA_ARTERIAL

## 2019-02-21 MED ORDER — METRONIDAZOLE IN NACL 5-0.79 MG/ML-% IV SOLN
500.0000 mg | Freq: Once | INTRAVENOUS | Status: AC
  Administered 2019-02-21: 500 mg via INTRAVENOUS
  Filled 2019-02-21: qty 100

## 2019-02-21 MED ORDER — EPINEPHRINE PF 1 MG/ML IJ SOLN
0.5000 ug/min | INTRAVENOUS | Status: DC
  Filled 2019-02-21: qty 4

## 2019-02-21 MED ORDER — SODIUM CHLORIDE 0.9 % IV SOLN: 250.0000 mL | INTRAVENOUS | Status: DC

## 2019-02-22 LAB — URINE CULTURE: Culture: NO GROWTH

## 2019-02-22 LAB — CALCIUM, IONIZED: Calcium, Ionized, Serum: 3.8 mg/dL — ABNORMAL LOW (ref 4.5–5.6)

## 2019-02-26 DIAGNOSIS — F10929 Alcohol use, unspecified with intoxication, unspecified: Secondary | ICD-10-CM

## 2019-02-26 DIAGNOSIS — E872 Acidosis: Secondary | ICD-10-CM

## 2019-02-26 DIAGNOSIS — D65 Disseminated intravascular coagulation [defibrination syndrome]: Secondary | ICD-10-CM

## 2019-02-26 DIAGNOSIS — J690 Pneumonitis due to inhalation of food and vomit: Secondary | ICD-10-CM

## 2019-02-26 DIAGNOSIS — E8729 Other acidosis: Secondary | ICD-10-CM

## 2019-02-26 DIAGNOSIS — R778 Other specified abnormalities of plasma proteins: Secondary | ICD-10-CM

## 2019-02-26 DIAGNOSIS — N179 Acute kidney failure, unspecified: Secondary | ICD-10-CM

## 2019-02-26 DIAGNOSIS — R7989 Other specified abnormal findings of blood chemistry: Secondary | ICD-10-CM

## 2019-02-26 DIAGNOSIS — R579 Shock, unspecified: Secondary | ICD-10-CM

## 2019-02-26 DIAGNOSIS — G931 Anoxic brain damage, not elsewhere classified: Secondary | ICD-10-CM

## 2019-02-26 LAB — CULTURE, BLOOD (ROUTINE X 2): Culture: NO GROWTH

## 2019-03-04 NOTE — Progress Notes (Signed)
RT Note:  Transferred patient from Trauma 1 in ED to 2H15 without any complications.  Suctioned patient before leaving in her airway and in her nares.  Made RT on 2H aware that patient has been bleeding from nares, patient was suctioned after transfer in nares as well.  RT made aware that patient sats have not been well.

## 2019-03-04 NOTE — Progress Notes (Addendum)
Inpatient Diabetes Program Recommendations  AACE/ADA: New Consensus Statement on Inpatient Glycemic Control (2015)  Target Ranges:  Prepandial:   less than 140 mg/dL      Peak postprandial:   less than 180 mg/dL (1-2 hours)      Critically ill patients:  140 - 180 mg/dL   Results for SHERL, [place]BUR[place] (MRN [id number]) as of 02/06/2019 07:11  Ref. Range 02/09/2019 00:17 02/10/2019 05:21  Glucose-Capillary Latest Ref Range: 70 - 99 mg/dL 013 (H) 143 (H)     Presented to the ED via EMS after having been found down/ On arrival patient was found to be unresponsive and pulseless arrest/ Initial rhythm was V. Fib/ She received defibrillation and went into PEA/ CPR was done and she received 9 rounds of EPI  History: HTN, ETOH Abuse  Current Insulin Orders: None yet    Note patient getting Solucortef 50 mg Q6 hours.  CBGs 270/ 353 mg/dl this AM  Anion Gap 23 and CO2 8 on 5:13am BMET and Lactic Acid >11    MD- Please consider starting Phase 2 (IV Insulin Drip) of the ICU Glycemic Control Protocol     --Will follow patient during hospitalization--  Ambrose Finland RN, MSN, CDE Diabetes Coordinator Inpatient Glycemic Control Team Team Pager: [phone] (8a-5p)

## 2019-03-04 NOTE — Progress Notes (Signed)
Called [facility].... spoke w/Abbie Charity fundraiser.... notified her of pt's low BP 95/51 w/MAP of 63 .... Max on Levo at 50 and pt is on Vasopressin at 0.03  Waiting on MD to call back.

## 2019-03-04 NOTE — Progress Notes (Signed)
  Echocardiogram 2D Echocardiogram has been performed.  Suzanne Hines Mar 04, 2019, 9:14 AM

## 2019-03-04 NOTE — ED Provider Notes (Addendum)
[facility] EMERGENCY DEPARTMENT Provider Note   CSN: [id number] Arrival date & time: 02/25/2019  2320    History   Chief Complaint Chief Complaint  Patient presents with  . CPR    HPI Suzanne Hines is a 53 y.o. female.     The history is provided by the EMS personnel and the spouse.  Cardiac Arrest  Witnessed by:  Not witnessed Incident location: in a car outside the home. Time before ALS initiated:  8-10 minutes Condition upon EMS arrival:  Apneic Pulse:  Absent Initial cardiac rhythm per EMS:  Ventricular fibrillation Treatments prior to arrival:  ACLS protocol, AED discharged, CCR and vascular access Medications given prior to ED:  Epinephrine (9 epis) Number of shocks delivered:  1 Rhythm after defibrillation:  PEA IV access type:  Peripheral Airway: kinf airway. Rhythm on admission to ED:  Sinus bradycardia Associated symptoms: no vomiting   Risk factors: no trauma   Patient with a h/o of HTN presents s/p cardiac arrest. Was out drinking at a friend's house with husband at 5 pm after eating hot dogs.  The patient had 2 "Trues" and 2 mixed drinks but normally can drink more than that without difficulty around 8 pm. Patient needed and  to be assisted to the car and was carried.  Was "snoring" and husband husband thought she was asleep and left her in the car and came and checked on her reportedly several times and the last time the patient was pulseless. EMS was contacted.  Initial rhythm VF and shock was delivered followed by PEA. EMS reported food and debris in the airway with king airway insertion. 9 epis given.  Sugar en route was 256.   No f/c/r.  No cough reported.  Works at home per husband and was in her usual state of health until this evening.    Past Medical History:  Diagnosis Date  . Hypertension     There are no active problems to display for this patient.   Past Surgical History:  Procedure Laterality Date  . ABLATION       OB  History   No obstetric history on file.      Home Medications    Prior to Admission medications   Medication Sig Start Date End Date Taking? Authorizing Provider  Amlodipine-Valsartan-HCTZ (EXFORGE HCT) 10-160-25 MG TABS Take 1 tablet by mouth daily.    [provider]  Misc Natural Products (DANDELION ROOT PO) Take 1 capsule by mouth 3 (three) times a week. Does not take on any specific day    [provider]  Multiple Vitamin (MULTIVITAMIN WITH MINERALS) TABS Take 1 tablet by mouth daily.    [provider]  Omega-3 Fatty Acids (FISH OIL) 1200 MG CAPS Take 1 capsule by mouth daily.    [provider]  potassium chloride SA (K-DUR,KLOR-CON) 20 MEQ tablet Take 1 tablet (20 mEq total) by mouth 2 (two) times daily. 11/07/12   Devoria Albe, MD  sulindac (CLINORIL) 150 MG tablet Take 150 mg by mouth daily.    [provider]    Family History No family history on file.  Social History Social History   Tobacco Use  . Smoking status: Current Some Day Smoker  Substance Use Topics  . Alcohol use: Yes  . Drug use: Not on file     Allergies   Ace inhibitors and Molds & smuts   Review of Systems Review of Systems  Unable to perform ROS:  Acuity of condition  Constitutional: Negative for fever.  Respiratory: Negative for cough.   Gastrointestinal: Negative for vomiting.     Physical Exam Updated Vital Signs BP (!) 93/58   Pulse 74   Resp 20   Ht 5\' 7"  (1.702 m)   Wt 90.7 kg   SpO2 (!) 78%   BMI 31.32 kg/m   Physical Exam Vitals signs and nursing note reviewed.  Constitutional:      Appearance: She is not diaphoretic.  HENT:     Head: Normocephalic and atraumatic.     Right Ear: Tympanic membrane normal.     Left Ear: Tympanic membrane normal.     Nose: Nose normal.     Mouth/Throat:     Mouth: Mucous membranes are moist.  Eyes:     Conjunctiva/sclera: Conjunctivae normal.     Comments: 6 mm and sluggish  Cardiovascular:      Rate and Rhythm: Regular rhythm. Bradycardia present.     Pulses: Normal pulses.     Heart sounds: Normal heart sounds.  Pulmonary:     Breath sounds: Rales present.  Abdominal:     General: Abdomen is flat. Bowel sounds are normal.     Hernia: No hernia is present.  Musculoskeletal:        General: No deformity.     Right lower leg: No edema.     Left lower leg: No edema.  Lymphadenopathy:     Cervical: No cervical adenopathy.  Skin:    General: Skin is dry.     Findings: No erythema.  Neurological:     GCS: GCS eye subscore is 1. GCS verbal subscore is 1. GCS motor subscore is 1.  Psychiatric:     Comments: unable      ED Treatments / Results  Labs (all labs ordered are listed, but only abnormal results are displayed) Labs Reviewed  CBC WITH DIFFERENTIAL/PLATELET - Abnormal; Notable for the following components:      Result Value   WBC 17.3 (*)    RBC 3.57 (*)    Hemoglobin 11.2 (*)    MCV 103.1 (*)    Platelets 124 (*)    All other components within normal limits  URINALYSIS, ROUTINE W REFLEX MICROSCOPIC - Abnormal; Notable for the following components:   Hgb urine dipstick SMALL (*)    Protein, ur 100 (*)    Bacteria, UA RARE (*)    All other components within normal limits  ETHANOL - Abnormal; Notable for the following components:   Alcohol, Ethyl (B) 388 (*)    All other components within normal limits  ACETAMINOPHEN LEVEL - Abnormal; Notable for the following components:   Acetaminophen (Tylenol), Serum <10 (*)    All other components within normal limits  I-STAT BETA HCG BLOOD, ED ([facility], [facility], [facility] ONLY) - Abnormal; Notable for the following components:   I-stat hCG, quantitative 8.3 (*)    All other components within normal limits  CBG MONITORING, ED - Abnormal; Notable for the following components:   Glucose-Capillary 270 (*)    All other components within normal limits  CULTURE, BLOOD (ROUTINE X 2)  CULTURE, BLOOD (ROUTINE X 2)  SARS CORONAVIRUS 2  (HOSPITAL ORDER, PERFORMED IN [facility])  RAPID URINE DRUG SCREEN, HOSP PERFORMED  SALICYLATE LEVEL  COMPREHENSIVE METABOLIC PANEL  LACTIC ACID, PLASMA  LACTIC ACID, PLASMA  PROTIME-INR  TROPONIN I  D-DIMER, QUANTITATIVE (NOT AT [facility])  PROCALCITONIN  LACTATE DEHYDROGENASE  TRIGLYCERIDES  FIBRINOGEN  C-REACTIVE  PROTEIN  FERRITIN    EKG EKG Interpretation  Date/Time:  Sunday Zyaire Dumas 19 2020 23:51:05 EDT Ventricular Rate:  77 PR Interval:    QRS Duration: 169 QT Interval:  529 QTC Calculation: 599 R Axis:   78 Text Interpretation:  Sinus rhythm Prolonged PR interval Right bundle branch block Long QTc Confirmed by Addilynne Olheiser ([id number]) on 2019-03-11 12:02:10 AM   Radiology Dg Chest Portable 1 View  Result Date: 2019-03-11 CLINICAL DATA:  Post intubation EXAM: PORTABLE CHEST 1 VIEW COMPARISON:  None. FINDINGS: Endotracheal tube tip is 1.7 cm above the carina. NG tube is in the stomach. Patchy bilateral airspace disease. Heart is borderline in size. No visible effusions or acute bony abnormality. IMPRESSION: Endotracheal tube 1.7 cm above the carina. Patchy bilateral airspace disease concerning for pneumonia. Electronically Signed   By: Charlett Nose M.D.   On: 03-11-2019 00:04    Procedures Procedure Name: Intubation Date/Time: 03-11-2019 2:47 AM Performed by: Cy Blamer, MD Pre-anesthesia Checklist: Patient identified, Patient being monitored, Emergency Drugs available, Timeout performed and Suction available Oxygen Delivery Method: Non-rebreather mask Preoxygenation: Pre-oxygenation with 100% oxygen Induction Type: Rapid sequence Ventilation: Mask ventilation without difficulty Laryngoscope Size: Glidescope and 4 Grade View: Grade IV Tube size: 7.5 mm Number of attempts: 2 Airway Equipment and Method: Patient positioned with wedge pillow Placement Confirmation: ETT inserted through vocal cords under direct vision,  CO2 detector and Breath sounds checked-  equal and bilateral Tube secured with: ETT holder Dental Injury: Teeth and Oropharynx as per pre-operative assessment  Difficulty Due To: Difficult Airway- due to anterior larynx, Difficult Airway- due to immobile epiglottis and Difficult Airway- due to large tongue Future Recommendations: Recommend- induction with short-acting agent, and alternative techniques readily available      (including critical care time)  Medications Ordered in ED Medications  sodium chloride flush (NS) 0.9 % injection 3 mL (has no administration in time range)  sodium bicarbonate 150 mEq in dextrose 5 % 1,000 mL infusion ( Intravenous New Bag/Given 11-Mar-2019 0017)  vancomycin (VANCOCIN) 1,750 mg in sodium chloride 0.9 % 500 mL IVPB (has no administration in time range)  potassium chloride 10 mEq in 100 mL IVPB (has no administration in time range)  0.9 %  sodium chloride infusion (has no administration in time range)  potassium chloride 20 MEQ/15ML (10%) solution 80 mEq (has no administration in time range)  ondansetron (ZOFRAN) injection 4 mg (has no administration in time range)  famotidine (PEPCID) IVPB 20 mg premix (has no administration in time range)  0.9 %  sodium chloride infusion (has no administration in time range)  norepinephrine (LEVOPHED)  in premix infusion (has no administration in time range)  fentaNYL (SUBLIMAZE) injection 50 mcg (has no administration in time range)  fentaNYL (SUBLIMAZE) injection 50-200 mcg (has no administration in time range)  docusate (COLACE) 50 MG/5ML liquid 100 mg (has no administration in time range)  bisacodyl (DULCOLAX) suppository 10 mg (has no administration in time range)  calcium gluconate 2 g/ 100 mL sodium chloride IVPB (has no administration in time range)  ceFEPIme (MAXIPIME) 2 g in sodium chloride 0.9 % 100 mL IVPB (has no administration in time range)  vasopressin (PITRESSIN) 40 Units in sodium chloride 0.9 % 250 mL (0.16 Units/mL) infusion (has no  administration in time range)  ceFEPIme (MAXIPIME) 2 g in sodium chloride 0.9 % 100 mL IVPB (has no administration in time range)  vancomycin (VANCOCIN) 1,750 mg in sodium chloride 0.9 % 500 mL IVPB (has  no administration in time range)  etomidate (AMIDATE) injection (100 mg Intravenous Given 02/15/2019 2337)  rocuronium [facility](ZEMURON) injection (80 mg Intravenous Given 02/14/2019 2338)  sodium bicarbonate injection 50 mEq (50 mEq Intravenous Given 06-05-19 0004)  0.9 %  sodium chloride infusion ( Intravenous Stopped 06-05-19 0123)  ceFEPIme (MAXIPIME) 2 g in sodium chloride 0.9 % 100 mL IVPB (0 g Intravenous Stopped 06-05-19 0133)  metroNIDAZOLE (FLAGYL) IVPB 500 mg (0 mg Intravenous Stopped 06-05-19 0248)     MDM Reviewed: nursing note and vitals Interpretation: x-ray, ECG and labs (elevated troponin, white count ddimer elevated elevated INR  hypokalemia  aspiration by me on on CXR) Total time providing critical care: > 105 minutes. This excludes time spent performing separately reportable procedures and services. Consults: cardiology and critical care  CRITICAL CARE Performed by: Quetzaly Ebner K Delani Kohli-Rasch Total critical care time: 105  minutes Critical care time was exclusive of separately billable procedures and treating other patients. Critical care was necessary to treat or prevent imminent or life-threatening deterioration. Critical care was time spent personally by me on the following activities: development of treatment plan with patient and/or surrogate as well as nursing, discussions with consultants, evaluation of patient's response to treatment, examination of patient, obtaining history from patient or surrogate, ordering and performing treatments and interventions, ordering and review of laboratory studies, ordering and review of radiographic studies, pulse oximetry and re-evaluation of patient's condition.  Sepsis antibiotics given   Myrene GalasMary A Lewey was evaluated in Emergency Department on  06-05-2019 for the symptoms described in the history of present illness. She was evaluated in the context of the global COVID-19 pandemic, which necessitated consideration that the patient might be at risk for infection with the SARS-CoV-2 virus that causes COVID-19. Institutional protocols and algorithms that pertain to the evaluation of patients at risk for COVID-19 are in a state of rapid change based on information released by regulatory bodies including the CDC and federal and state organizations. These policies and algorithms were followed during the patient's care in the ED.   We have not given large volume fluid as there is a serious concern for COVID and this will make patient worse.  Patient already with low saturation post intubation.     Case dw Dr. Darrick Pennaeterding too long down time, no cooling   Final Clinical Impressions(s) / ED Diagnoses   Admit to ICU,  Cardiology to consult.     Breeze Berringer, MD 008-02-20 [phone]    Cy BlamerPalumbo, Analysa Nutting, MD 008-02-20 [phone]

## 2019-03-04 NOTE — Progress Notes (Signed)
CDS notified. Pt is a registered organ donor.  [employer]  CDS rep will call unit shortly.

## 2019-03-04 NOTE — Progress Notes (Signed)
Called eLink and notified of CBG of 353 and blood gas results... Spoke w/Abbie Charity fundraiser.

## 2019-03-04 NOTE — Progress Notes (Signed)
Nutrition Brief Note  Chart reviewed. Pt now transitioning to comfort care.  No further nutrition interventions warranted at this time.  Please re-consult as needed.   Trayvon Trumbull RD, LDN Clinical Nutrition Pager # - [phone]    

## 2019-03-04 NOTE — Progress Notes (Signed)
I responded to a request from the nurse to provide spiritual support for the patient's family. I visited the patient's room with her husband, Fae Pippin, and mother-in-law, Learta Codding, present. I shared words of encouragement and led in prayer. I shared that the Chaplain is available for additional support as needed or requested.    March 02, 2019 1100  Clinical Encounter Type  Visited With Patient and family together  Visit Type Spiritual support  Referral From Nurse  Consult/Referral To Chaplain  Spiritual Encounters  Spiritual Needs Prayer;Emotional  Stress Factors  Patient Stress Factors None identified  Family Stress Factors Exhausted    Chaplain Dr Melvyn Novas

## 2019-03-04 NOTE — H&P (Addendum)
NAME:  Suzanne Hines, MRN:  [id number], DOB:  1966/09/11, LOS: 0 ADMISSION DATE:  02/04/2019, CONSULTATION DATE:  Feb 27, 2019 REFERRING MD:  ED physician, CHIEF COMPLAINT:  Cardiac arrest    History of present illness   Suzanne Hines is a 53 year old female with history of hypertension and alcohol abuse who presented to the ED via EMS after having been found down.  No family to provide history.  History obtained by chart review and bedside nurse.  Apparently patient and husband or at a friend's house drinking.  Patient had drunk quite a bit and had started complaining of weakness.  She was taken to the car by the husband who left inside the car.  Intermittently checked on her.  Later on he drove the patient home.  He had noticed that patient was having sonorous breathing.  At some point patient became unresponsive.  EMS was called.  On arrival patient was found to be unresponsive and pulseless arrest.  Initial rhythm was V. fib.  She received defibrillation and went into PEA.  CPR was done and she received 9 rounds of epi.  Reportedly she had been doing well prior to this episode.  No complaints of cough fever or chills.  EMS intubated her with a King's airway which was exchanged for an ETT in the ED.  EMS gave her 700 mL's of fluid and she received 3 L of fluid in the ED.  She was given a dose of Vanco and cefepime in the emergency room.  Started on epinephrine drip for hypertension.  Past Medical History  -Hypertension -Alcohol abuse  Significant Hospital Events   -Cardiac arrest 27-Feb-2019  Consults:  -PCCM  Procedures:  -Endotracheal intubation 02-27-2019 -Central line insertion 02-27-19 -Arterial line insertion  Significant Diagnostic Tests:  CXR-bilateral infiltrates  Micro Data:  Pending   Antimicrobials:  -Vancomycin -Zosyn  Objective   Blood pressure (!) 93/58, pulse 74, resp. rate 20, height 5\' 7"  (1.702 m), weight 90.7 kg, SpO2 (!) 78 %.    Vent Mode: PRVC FiO2  (%):  [100 %] 100 % Set Rate:  [20 bmp] 20 bmp Vt Set:  [420 mL] 420 mL PEEP:  [10 cmH20] 10 cmH20 Plateau Pressure:  [22 cmH20] 22 cmH20   Intake/Output Summary (Last 24 hours) at Feb 27, 2019 0213 Last data filed at 27-Feb-2019 0038 Gross per 24 hour  Intake 3000 ml  Output 40 ml  Net 2960 ml   Filed Weights   03/02/2019 2357  Weight: 90.7 kg    Examination: General: Intubated.  Unresponsive.  Has blood oozing out of mouth and nose. HENT: Endotracheal tube orally and OG tube in situ.  Bleeding from nose and mouth Lungs: Clear to auscultation and Cardiovascular: S1-S2 heard normal, no added sounds Abdomen: Soft, no organomegaly palpated Extremities: No lower extremity edema Neuro: Unresponsive GU: Normal    Assessment & Plan:  #Cardiac arrest Patient presented with cardiac arrest.  Initial rhythm 1 EMS got to the patient was V. fib.  Unclear what the etiology of her cardiac arrest is but given V. fib was suspect cardiac etiology.  However other possibilities include respiratory arrest given that patient was heavily intoxicated leading to cardiac arrest.   -We will obtain echo to evaluate cardiac function -Cardiology has been consulted to evaluate patient due to V. fib arrest.  Patient is currently very critically ill and would not be a good candidate for any interventional cardiology procedures at this point in time. -Patient is not a candidate for targeted  temperature management given coagulopathy  #Acute respiratory failure requiring mechanical ventilation #ARDS Patient with respiratory failure following cardiac arrest.  There is concern for aspiration.  She is quickly developed ARDS requiring high ventilator settings.  She is on 100% FiO2 and PEEP of 15 with O2 sats in the 80s. -Continue lung protective ventilation strategies -COVID-19 infection has been ruled out -Obtain tracheal aspirate culture -Patient is completely unresponsive with good interaction with the ventilator.   No dyssynchronies noted at this time.  If she becomes dyssynchronous will consider sedation and paralysis. -Would consider proning but the patient is too hemodynamically unstable  #Undifferentiated shock #Severe metabolic acidosis  #Lactic acidosis  Shock could be multifactorial due to sepsis versus cardiogenic.  She has severe lactic acidosis leading to severe anion gap metabolic acidosis due to shock. -Empiric antibiotics with vancomycin and cefepime -Obtain blood culture -Obtain tracheal aspirate for culture -Obtain urine culture -Echo to evaluate cardiac function -Trend lactic acid every 4hrs -Central line and arterial line placed -Switch epi to norepinephrine -Add vasopressin -Stress dose steroids -Titrate pressors to MAP of >65 -Continue bicarb drip that has been initiated in the ED -Consider CRRT due to severe lactic acidosis though unlikely to be beneficial given the severity of illness  #Acute encephalopathy  Worrisome for hypoxic brain injury due to post prolonged cardiac arrest -Avoid secondary brain injuries -Treat fever -Maintain euglycemia -Continue to monitor and obtain CT scan head if no improvement  #Coagulopathy Patient likely in DIC given elevated INR and low fibrinogen.  She is bleeding from her orifices. -We will transfuse 2 packs of cryoprecipitate -Trend fibrinogen and transfuse to a goal of >150 -We will transfuse packed red blood cells if hemoglobin less than 7 -Transfuse platelets if less than 50,000 and bleeding  #Electrolyte abnormalities -Hypokalemia, Hypocalcemia  -Potassium and calcium repleted -We will repeat BMP  Best practice:  Diet: N.p.o., dietitian to evaluate for tube feeds. Pain/Anxiety/Delirium protocol (if indicated): PRN fentanyl VAP protocol (if indicated): Famotidine twice daily DVT prophylaxis: Patient is coagulopathic so no pharmacological DVT prophylaxis GI prophylaxis: IV famotidine Glucose control: Maintain euglycemia  Mobility: PT and OT once appropriate Code Status: Full code Family Communication: I have not communicated with patient's husband given that he is likely very intoxicated as he was drinking with the patient.  Team will touch base with the husbandin the morning. Disposition: Admit to ICU  Labs   CBC: Recent Labs  Lab 02/06/2019 0033  WBC 17.3*  NEUTROABS 12.3*  HGB 11.2*  HCT 36.8  MCV 103.1*  PLT 124*    Basic Metabolic Panel: Recent Labs  Lab 02/23/2019 0033  NA 133*  K 2.6*  CL 109  CO2 10*  GLUCOSE 274*  BUN 8  CREATININE 1.00  CALCIUM 6.0*   GFR: Estimated Creatinine Clearance: 75.2 mL/min (by C-G formula based on SCr of 1 mg/dL). Recent Labs  Lab 02/27/2019 0033  WBC 17.3*  LATICACIDVEN >11.0*    Liver Function Tests: Recent Labs  Lab 02/07/2019 0033  AST 273*  ALT 221*  ALKPHOS 65  BILITOT 0.3  PROT <3.0*  ALBUMIN 1.6*   ABG No results found for: PHART, PCO2ART, PO2ART, HCO3, TCO2, ACIDBASEDEF, O2SAT   Coagulation Profile: Recent Labs  Lab 02/12/2019 0033  INR 3.0*    Cardiac Enzymes: Recent Labs  Lab 02/12/2019 0033  TROPONINI 0.42*    HbA1C: No results found for: HGBA1C  CBG: Recent Labs  Lab 02/18/2019 0017  GLUCAP 270*    Review of Systems:  Unable to obtain due to patient's current condition.  Past Medical History  She,  has a past medical history of Hypertension.   Surgical History    Past Surgical History:  Procedure Laterality Date  . ABLATION       Social History   reports that she has been smoking. She does not have any smokeless tobacco history on file. She reports current alcohol use.   Family History   Her family history is not on file.   Allergies Allergies  Allergen Reactions  . Ace Inhibitors Swelling    Lips swell  . Molds & Smuts Itching and Other (See Comments)     Reaction:allergy like symptoms     Home Medications  Prior to Admission medications   Medication Sig Start Date End Date Taking?  Authorizing Provider  Amlodipine-Valsartan-HCTZ (EXFORGE HCT) 10-160-25 MG TABS Take 1 tablet by mouth daily.    [provider]  Misc Natural Products (DANDELION ROOT PO) Take 1 capsule by mouth 3 (three) times a week. Does not take on any specific day    [provider]  Multiple Vitamin (MULTIVITAMIN WITH MINERALS) TABS Take 1 tablet by mouth daily.    [provider]  Omega-3 Fatty Acids (FISH OIL) 1200 MG CAPS Take 1 capsule by mouth daily.    [provider]  potassium chloride SA (K-DUR,KLOR-CON) 20 MEQ tablet Take 1 tablet (20 mEq total) by mouth 2 (two) times daily. 11/07/12   Devoria Albe, MD  sulindac (CLINORIL) 150 MG tablet Take 150 mg by mouth daily.    [provider]     Critical care time: The patient is critically ill with multiple organ systems failure and requires high complexity decision making for assessment and support, frequent evaluation and titration of therapies, application of advanced monitoring technologies and extensive interpretation of multiple databases.   Critical Care Time devoted to patient care services described in this note is 70 Minutes. This time reflects time of care of this signee Dr. [name]. This critical care time does not reflect procedure time, or teaching time or supervisory time of PA/NP/Med student/Med Resident etc but could involve care discussion time.  [name], M.D. [facility] Pulmonary/Critical Care Medicine. Pager:  After hours pager: [phone].

## 2019-03-04 NOTE — ED Notes (Signed)
Provider now starting A-line.

## 2019-03-04 NOTE — Progress Notes (Signed)
RT NOTE: RT extubated patient to comfort with RN at bedside per family wishes and MD order. 

## 2019-03-04 NOTE — Progress Notes (Signed)
Arrived. Pt was maxed on pressure support and vent settings, (MAPs in 40s, O2 stat in 80s). Pt was unresponsive upon assessment. Dr. Tonia Brooms was paged, and came to bedside. Phone call conversation with family regarding goals of care. Pt made DNR. CDS consulted with question of potential organ donation. Pt ruled out for organ donation, but remained potential for tissue donation. Pt's family at bedside now. Waiting on daughter to arrive from [place] before family decides on withdrawing from care.  Suzanne Hines, 02/16/2019, 0830

## 2019-03-04 NOTE — ED Notes (Signed)
Updated provider on pt status and critical lab values.  Critical care provider requested RN get ready for her to insert centeral line, done.  Called pharmacy to request potassium soluition for OG tube.

## 2019-03-04 NOTE — Progress Notes (Signed)
After planned comfort care, pt was extubated by RRT. A 1449, pt went asystole. Tariq Pernell and Johnnye Sima, RN ausculted heart and lung sounds and found pt to have none. Pt. pronounced deceased. Pt surrounded by husband, daughters, sister in law.   ME called regarding question of whether pt is a ME case.  CDS called and states will call back.   Suzanne Hines. 02/06/2019. [id number]

## 2019-03-04 NOTE — Death Summary Note (Signed)
DEATH SUMMARY   Patient Details  Name: Suzanne GalasMary A Hines MRN: [id number] DOB: 01-20-1966  Admission/Discharge Information   Admit Date:  July 08, 2019  Date of Death: Date of Death: 02/23/2019  Time of Death: Time of Death: 1449  Length of Stay: 1  Referring Physician: Alinda DeemPenner, Pamela, MD   Reason(s) for Hospitalization  53 yo FM, history of alcohol abuse, found unresponsive, pulseless, CPR was started and patient obtained ROSC.  Diagnoses  Preliminary cause of death: Anoxic brain injury (HCC) Secondary Diagnoses (including complications and co-morbidities):  Principal Problem:   Cardiac arrest (HCC) Active Problems:   DIC (disseminated intravascular coagulation) (HCC)   Elevated troponin   Alcoholic intoxication with complication (HCC)   Aspiration pneumonia (HCC)   Shock (HCC)   Anoxic brain injury (HCC)   High anion gap metabolic acidosis   Acute renal failure (ARF) [facility](HCC)   Brief Hospital Course (including significant findings, care, treatment, and services provided and events leading to death)  Suzanne Hines is a 53 y.o. year old female who history of hypertension and alcohol abuse who presented to the ED via EMS after having been found down.  No family to provide history.  History obtained by chart review and bedside nurse.  Apparently patient and husband or at a friend's house drinking.  Patient had drunk quite a bit and had started complaining of weakness.  She was taken to the car by the husband who left inside the car.  Intermittently checked on her.  Later on he drove the patient home.  He had noticed that patient was having sonorous breathing.  At some point patient became unresponsive.  EMS was called.  On arrival patient was found to be unresponsive and pulseless arrest.  Initial rhythm was V. fib.  She received defibrillation and went into PEA.  CPR was done and she received 9 rounds of epi.  Reportedly she had been doing well prior to this episode.  No complaints of  cough fever or chills.  EMS intubated her with a King's airway which was exchanged for an ETT in the ED.  EMS gave her 700 mL's of fluid and she received 3 L of fluid in the ED.  She was given a dose of Vanco and cefepime in the emergency room.  Started on epinephrine drip for hypertension.  Ultimately, the patient was brought to the ICU and had progressive multiorgan failure and coagulopathy. The family was notified of her progressive decline. We waited for all family, husband, daughter, and friends to gather at the bedside. They made the collective decision to withdraw care. Comfort care orders were placed and the patient passed peacefully with family at bedside.    Pertinent Labs and Studies  Significant Diagnostic Studies Dg Chest Port 1 View  Result Date: 02/03/2019 CLINICAL DATA:  Central line placement EXAM: PORTABLE CHEST 1 VIEW COMPARISON:  0September 04, 2020 FINDINGS: Endotracheal tube tip is just above the carina and could be retracted by 4-5 cm to place it at the level of the clavicular heads. Right IJ central venous catheter tip is at the cavoatrial junction. Enteric tube tip courses below the field of view. The cardiomediastinal contours are normal. Bilateral hazy airspace opacities. No pleural effusion or pneumothorax. IMPRESSION: Endotracheal tube tip just above the carina. Recommend retracting by 4-5 cm. Right IJ approach CVC catheter tip at the cavoatrial junction. Electronically Signed   By: Deatra RobinsonKevin  Herman M.D.   On: 02/14/2019 03:36   Dg Chest Portable 1 View  Result Date: 03/02/2019 CLINICAL  DATA:  Post intubation EXAM: PORTABLE CHEST 1 VIEW COMPARISON:  None. FINDINGS: Endotracheal tube tip is 1.7 cm above the carina. NG tube is in the stomach. Patchy bilateral airspace disease. Heart is borderline in size. No visible effusions or acute bony abnormality. IMPRESSION: Endotracheal tube 1.7 cm above the carina. Patchy bilateral airspace disease concerning for pneumonia. Electronically  Signed   By: Charlett Nose M.D.   On: 03/03/2019 00:04    Microbiology Recent Results (from the past 240 hour(s))  SARS Coronavirus 2 [facility] order, Performed in [facility] hospital lab)     Status: None   Collection Time: 02/14/2019 12:18 AM  Result Value Ref Range Status   SARS Coronavirus 2 NEGATIVE NEGATIVE Final    Comment: (NOTE) If result is NEGATIVE SARS-CoV-2 target nucleic acids are NOT DETECTED. The SARS-CoV-2 RNA is generally detectable in upper and lower  respiratory specimens during the acute phase of infection. The lowest  concentration of SARS-CoV-2 viral copies this assay can detect is 250  copies / mL. A negative result does not preclude SARS-CoV-2 infection  and should not be used as the sole basis for treatment or other  patient management decisions.  A negative result may occur with  improper specimen collection / handling, submission of specimen other  than nasopharyngeal swab, presence of viral mutation(s) within the  areas targeted by this assay, and inadequate number of viral copies  (<250 copies / mL). A negative result must be combined with clinical  observations, patient history, and epidemiological information. If result is POSITIVE SARS-CoV-2 target nucleic acids are DETECTED. The SARS-CoV-2 RNA is generally detectable in upper and lower  respiratory specimens dur ing the acute phase of infection.  Positive  results are indicative of active infection with SARS-CoV-2.  Clinical  correlation with patient history and other diagnostic information is  necessary to determine patient infection status.  Positive results do  not rule out bacterial infection or co-infection with other viruses. If result is PRESUMPTIVE POSTIVE SARS-CoV-2 nucleic acids MAY BE PRESENT.   A presumptive positive result was obtained on the submitted specimen  and confirmed on repeat testing.  While 2019 novel coronavirus  (SARS-CoV-2) nucleic acids may be present in the submitted  sample  additional confirmatory testing may be necessary for epidemiological  and / or clinical management purposes  to differentiate between  SARS-CoV-2 and other Sarbecovirus currently known to infect humans.  If clinically indicated additional testing with an alternate test  methodology [phone]) is advised. The SARS-CoV-2 RNA is generally  detectable in upper and lower respiratory sp ecimens during the acute  phase of infection. The expected result is Negative. Fact Sheet for Patients:  BoilerBrush.com.cy Fact Sheet for Healthcare Providers: https://pope.com/ This test is not yet approved or cleared by the [place] FDA and has been authorized for detection and/or diagnosis of SARS-CoV-2 by FDA under an Emergency Use Authorization (EUA).  This EUA will remain in effect (meaning this test can be used) for the duration of the COVID-19 declaration under Section 564(b)(1) of the Act, 21 U.S.C. section 360bbb-3(b)(1), unless the authorization is terminated or revoked sooner. Performed at [facility], [address] [place], [place] [address]   Culture, Urine     Status: None   Collection Time: 02/19/2019 12:35 AM  Result Value Ref Range Status   Specimen Description URINE, CLEAN CATCH  Final   Special Requests NONE  Final   Culture   Final    NO GROWTH Performed at  [facility], [address], [place], [place] [address]    Report Status 02/22/2019 FINAL  Final  Culture, blood (Routine x 2)     Status: None   Collection Time: 02/16/2019  1:02 AM  Result Value Ref Range Status   Specimen Description BLOOD RIGHT HAND  Final   Special Requests   Final    BOTTLES DRAWN AEROBIC AND ANAEROBIC Blood Culture results may not be optimal due to an inadequate volume of blood received in culture bottles   Culture   Final    NO GROWTH 5 DAYS Performed at [facility], [address] [place], [place] [address]    Report  Status 02/26/2019 FINAL  Final  MRSA PCR Screening     Status: None   Collection Time: 02/22/2019  3:15 AM  Result Value Ref Range Status   MRSA by PCR NEGATIVE NEGATIVE Final    Comment:        The GeneXpert MRSA Assay (FDA approved for NASAL specimens only), is one component of a comprehensive MRSA colonization surveillance program. It is not intended to diagnose MRSA infection nor to guide or monitor treatment for MRSA infections. Performed at [facility], [address] [place], [place] [address]     Lab Basic Metabolic Panel: Recent Labs  Lab 02/13/2019 0033 02/16/2019 0155 02/06/2019 0513 02/13/2019 0527  NA 133*  --  133* 135  K 2.6*  --  3.0* 3.0*  CL 109  --  102  --   CO2 10*  --  8*  --   GLUCOSE 274*  --  383*  --   BUN 8  --  10  --   CREATININE 1.00  --  1.24*  --   CALCIUM 6.0*  --  7.1*  --   MG  --  2.4 2.2  --   PHOS  --   --  4.5  --    Liver Function Tests: Recent Labs  Lab 02/23/2019 0033  AST 273*  ALT 221*  ALKPHOS 65  BILITOT 0.3  PROT <3.0*  ALBUMIN 1.6*   No results for input(s): LIPASE, AMYLASE in the last 168 hours. No results for input(s): AMMONIA in the last 168 hours. CBC: Recent Labs  Lab 02/28/2019 0033 02/23/2019 0513 02/15/2019 0527 02/23/2019 0729 02/22/2019 0855  WBC 17.3* 18.6*  --   --   --   NEUTROABS 12.3*  --   --   --   --   HGB 11.2* 15.6* 16.3*  --   --   HCT 36.8 48.7* 48.0*  --   --   MCV 103.1* 98.2  --   --   --   PLT 124* 180  172  --  155 150   Cardiac Enzymes: Recent Labs  Lab 03/03/2019 0033  TROPONINI 0.42*   Sepsis Labs: Recent Labs  Lab 02/28/2019 0033 02/19/2019 0210 03/03/2019 0513  PROCALCITON 0.10  --   --   WBC 17.3*  --  18.6*  LATICACIDVEN >11.0* >11.0* >11.0*    Procedures/Operations   ETT    Elige Radon L Nicole Defino 02/26/2019, 1:27 PM

## 2019-03-04 NOTE — Procedures (Signed)
Central Venous Catheter Insertion Procedure Note ZOBEIDA THUMMEL [id number] 11/30/1965  Procedure: Insertion of Central Venous Catheter Indications: Assessment of intravascular volume, Drug and/or fluid administration and Frequent blood sampling  Procedure Details Consent: Unable to obtain consent because of emergent medical necessity. Time Out: Verified patient identification, verified procedure, site/side was marked, verified correct patient position, special equipment/implants available, medications/allergies/relevent history reviewed, required imaging and test results available.  Performed  Maximum sterile technique was used including antiseptics, cap, gloves, gown, hand hygiene, mask and sheet. Skin prep: Chlorhexidine; local anesthetic administered A antimicrobial bonded/coated triple lumen catheter was placed in the right internal jugular vein using the Seldinger technique.  Evaluation Blood flow good Complications: No apparent complications Patient did tolerate procedure well. Chest X-ray ordered to verify placement.  CXR: normal.  OGAKE, STELLA [name] 02/20/2019, 3:23 AM

## 2019-03-04 NOTE — Procedures (Signed)
Arterial Catheter Insertion Procedure Note Suzanne Hines [id number] Mar 17, 1966  Procedure: Insertion of Arterial Catheter  Indications: Blood pressure monitoring and Frequent blood sampling  Procedure Details Consent: Unable to obtain consent because of emergent medical necessity. Time Out: Verified patient identification, verified procedure, site/side was marked, verified correct patient position, special equipment/implants available, medications/allergies/relevent history reviewed, required imaging and test results available.  Performed  Maximum sterile technique was used including antiseptics, cap, gloves, gown, hand hygiene, mask and sheet. Skin prep: Chlorhexidine; local anesthetic administered 20 gauge catheter was inserted into right femoral artery using the Seldinger technique. ULTRASOUND GUIDANCE USED: YES Evaluation Blood flow good; BP tracing good. Complications: No apparent complications.   Suzanne Hines Suzanne Hines 03/04/2019

## 2019-03-04 NOTE — Progress Notes (Signed)
Rec'd call from [facility] RN at [place]... ok to use OG tube per Dr. Darrick Penna

## 2019-03-04 NOTE — Consult Note (Signed)
I discussed with Dr. Elige Radon this morning. Ou of hospital ?Vfib cardiac arrest with extremely guarded prognosis since arrival. No STEMI on EKG. No cardiac intervention warranted at this time. Consult has been canceled.   Elder Negus, MD [facility] [place] Pager: [phone] Office: [phone] If no answer Cell [phone]

## 2019-03-04 NOTE — Progress Notes (Signed)
Chaplain received page from Charge  Nurse Clydie Braun regarding patient arrival and active CPR with husband in Consultation room. Chaplain arrived and checked in with Clydie Braun. Chaplain then introduced self to [place], patient husband and provided supportive presence, active listening, and prayer this early morning. Husband is quiet and reserved. Chaplain will continue to remain available as needed or requested.

## 2019-03-04 NOTE — Progress Notes (Signed)
Pharmacy Antibiotic Note  Suzanne Hines is a 53 y.o. female admitted on 02/14/2019 with sepsis.  Pharmacy has been consulted for vancomycin and cefepime dosing.  Plan: Vancomycin 1750 mg IV q24 hours Cefepime 2gm IV q8 hours F/u renal function, cultures and clinical course  Height: 5\' 7"  (170.2 cm) Weight: 200 lb (90.7 kg) IBW/kg (Calculated) : 61.6  No data recorded.  Recent Labs  Lab 03-05-19 0033  WBC 17.3*  CREATININE 1.00  LATICACIDVEN >11.0*    Estimated Creatinine Clearance: 75.2 mL/min (by C-G formula based on SCr of 1 mg/dL).    Allergies  Allergen Reactions  . Ace Inhibitors Swelling    Lips swell  . Molds & Smuts Itching and Other (See Comments)     Reaction:allergy like symptoms     Thank you for allowing pharmacy to be a part of this patient's care.  Talbert Cage [place]et Mar 05, 2019 2:22 AM

## 2019-03-04 NOTE — Progress Notes (Signed)
PCCM:  Please see full documentation from H&P this morning by D. Ogake.   I called and spoke with the patients husband. He understands the severity of the patients illness and that she is likely to die from this situation.   At this point he agrees that her becoming a DNR is the most appropriate situation and that additional cardiac arrest with CPR would not change her outcome.   He would like to come to the hospital to see her before considering withdrawal of care.   BP (!) 79/52 (BP Location: Left Arm)   Pulse 82   Temp (!) 90.7 F (32.6 C)   Resp (!) 36   Ht 5\' 7"  (1.702 m)   Wt 85.7 kg   SpO2 (!) 85%   BMI 29.59 kg/m   Gen: intubated, no sedation, unresponsive  Heart: sinus on tele, s1 s2 Lungs: BL vented breaths Neuro: pupils 22mm fixed dilated, no corneals, no cough, no gag, not breathing over ventilator, hypothermic (self induced)   ABG    Component Value Date/Time   PHART 7.038 (LL) 2019/03/16 0527   PCO2ART 26.0 (L) Mar 16, 2019 0527   PO2ART 52.0 (L) Mar 16, 2019 0527   HCO3 7.7 (L) 03/16/2019 0527   TCO2 9 (L) 03-16-2019 0527   ACIDBASEDEF 24.0 (H) 03/16/2019 0527   O2SAT 86.0 03/16/2019 0527   A:  MODS Cardiac arrest Cardiogenic shock  Severe AGMA  Refractory shock, multiple vasopressors Severe Anoxic brain injury, loss of brain stem reflexes - unable to do brain death testing due to hypothermia and multiple metabolic derrangements  P:  DNR  We will discuss further transition to comfort care once family arrives  I do not think her situation is survivable.  Continue support care at this time Multiple vasopressors titrating to MAP >65 if possible   This patient is critically ill with multiple organ system failure; which, requires frequent high complexity decision making, assessment, support, evaluation, and titration of therapies. This was completed through the application of advanced monitoring technologies and extensive interpretation of multiple databases.  During this encounter critical care time was devoted to patient care services described in this note for 38 minutes.   Josephine Igo, DO [facility] 03-16-2019 8:36 AM  Personal pager: [phone] If unanswered, please page CCM On-call: #[phone]

## 2019-03-04 DEATH — deceased

## 2020-03-06 IMAGING — DX PORTABLE CHEST - 1 VIEW
1 series · 1 of 1 positions shown · non-contrast
Comparison: None.

CLINICAL DATA: Post intubation

EXAM:
PORTABLE CHEST 1 VIEW

[chest ap]
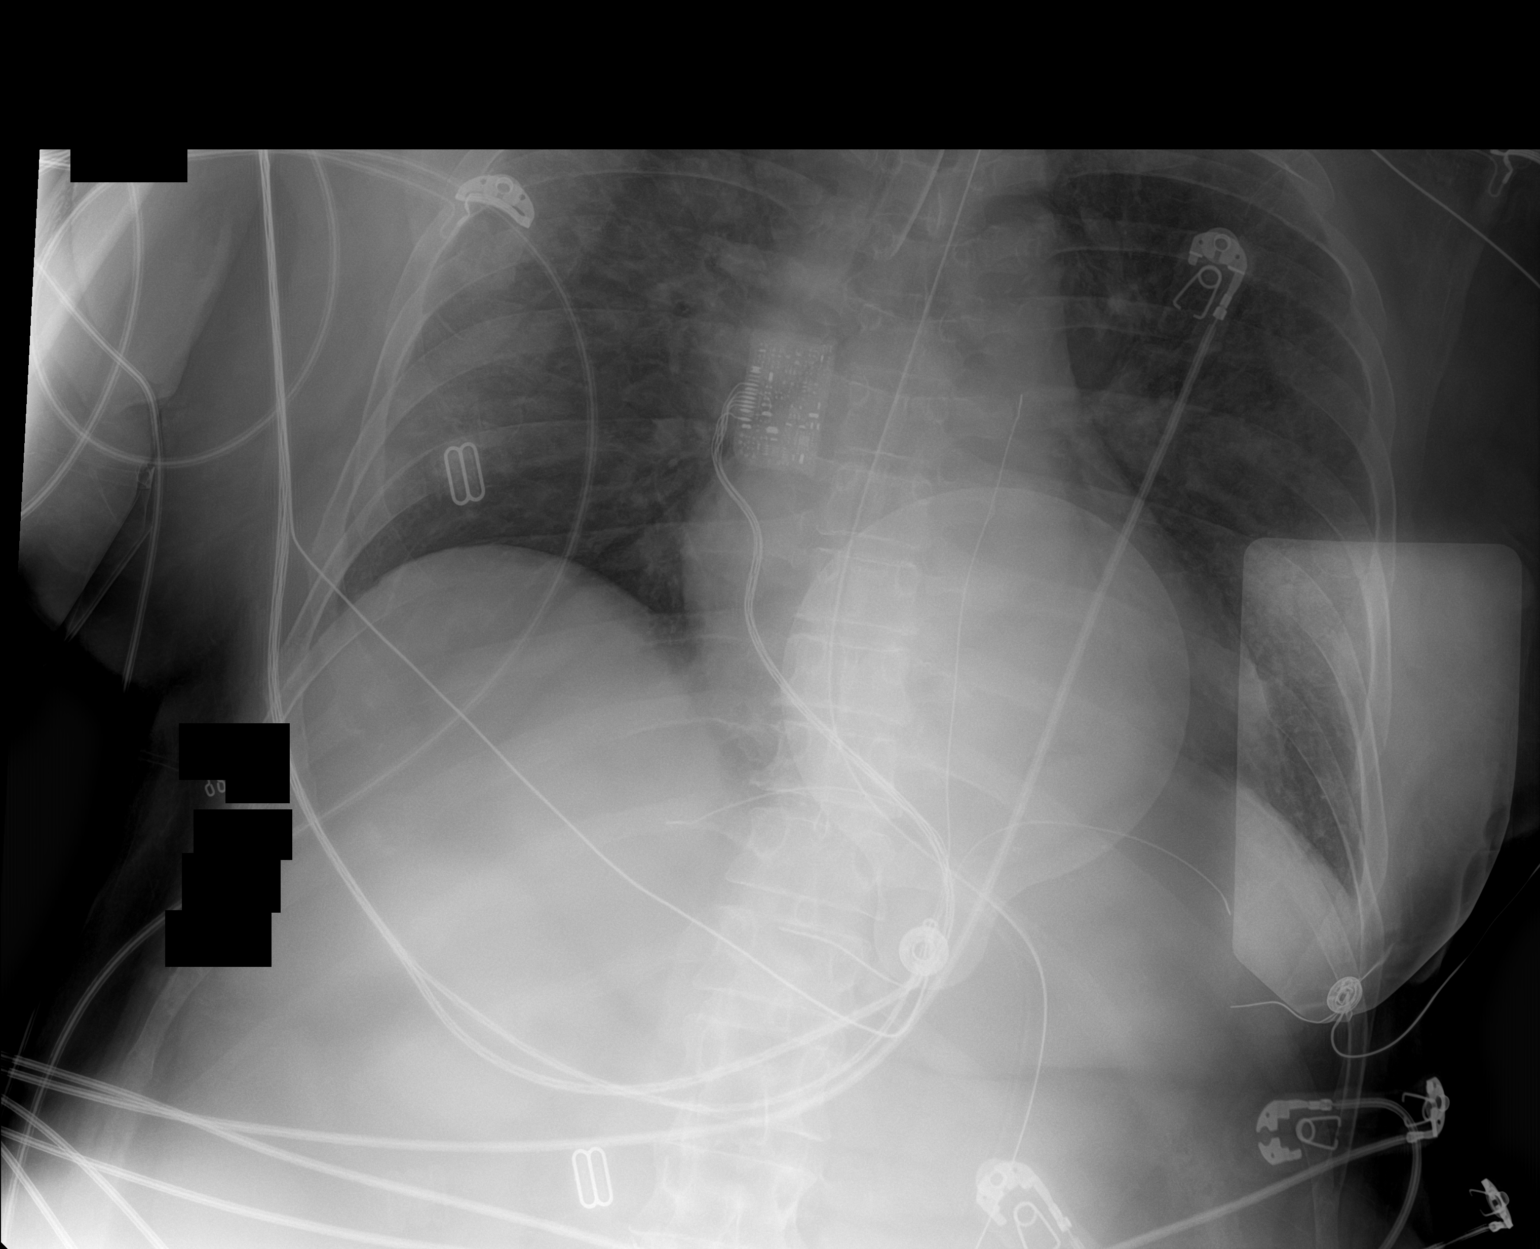

[1 of 1 positions shown; findings below may reference images not displayed]

FINDINGS: Endotracheal tube tip is 1.7 cm above the carina. NG tube is in the
stomach. Patchy bilateral airspace disease. Heart is borderline in
size. No visible effusions or acute bony abnormality.
IMPRESSION: Endotracheal tube 1.7 cm above the carina.

Patchy bilateral airspace disease concerning for pneumonia.
